# Patient Record
Sex: Male | Born: 1955
Health system: Southern US, Community
[De-identification: ages and names within clinical notes are randomized; demographics above are authoritative.]

## PROBLEM LIST (undated history)

## (undated) DIAGNOSIS — I471 Supraventricular tachycardia, unspecified: Secondary | ICD-10-CM

---

## 2017-08-05 ENCOUNTER — Encounter: Payer: Self-pay | Admitting: *Deleted

## 2017-11-17 ENCOUNTER — Ambulatory Visit
Admission: RE | Admit: 2017-11-17 | Discharge: 2017-11-17 | Disposition: A | Discharge status: Home / Self Care | Payer: Self-pay | Source: Ambulatory Visit | Attending: Internal Medicine | Admitting: Internal Medicine

## 2017-11-17 ENCOUNTER — Other Ambulatory Visit: Payer: Self-pay | Admitting: Internal Medicine

## 2017-11-17 DIAGNOSIS — R52 Pain, unspecified: Secondary | ICD-10-CM

## 2017-11-17 DIAGNOSIS — R079 Chest pain, unspecified: Secondary | ICD-10-CM | POA: Insufficient documentation

## 2019-03-29 DIAGNOSIS — M67911 Unspecified disorder of synovium and tendon, right shoulder: Secondary | ICD-10-CM | POA: Diagnosis not present

## 2019-03-29 DIAGNOSIS — M542 Cervicalgia: Secondary | ICD-10-CM | POA: Diagnosis not present

## 2019-03-29 DIAGNOSIS — K2900 Acute gastritis without bleeding: Secondary | ICD-10-CM | POA: Diagnosis not present

## 2019-03-29 DIAGNOSIS — M489 Spondylopathy, unspecified: Secondary | ICD-10-CM | POA: Diagnosis not present

## 2019-03-30 ENCOUNTER — Other Ambulatory Visit: Payer: Self-pay

## 2019-03-30 ENCOUNTER — Other Ambulatory Visit
Admission: RE | Admit: 2019-03-30 | Discharge: 2019-03-30 | Disposition: A | Discharge status: Home / Self Care | Payer: BC Managed Care – PPO | Source: Ambulatory Visit | Attending: Internal Medicine | Admitting: Internal Medicine

## 2019-03-30 DIAGNOSIS — Z20822 Contact with and (suspected) exposure to covid-19: Secondary | ICD-10-CM

## 2019-03-30 DIAGNOSIS — R197 Diarrhea, unspecified: Secondary | ICD-10-CM | POA: Insufficient documentation

## 2019-03-30 LAB — BASIC METABOLIC PANEL
Anion gap: 10 (ref 5–15)
BUN: 10 mg/dL (ref 8–23)
CO2: 29 mmol/L (ref 22–32)
Calcium: 9.8 mg/dL (ref 8.9–10.3)
Chloride: 102 mmol/L (ref 98–111)
Creatinine, Ser: 1.08 mg/dL (ref 0.61–1.24)
GFR calc Af Amer: >60 mL/min (ref >60)
GFR calc non Af Amer: >60 mL/min (ref >60)
Glucose, Bld: 110 mg/dL — ABNORMAL HIGH (ref 70–99)
Potassium: 4.7 mmol/L (ref 3.5–5.1)
Sodium: 141 mmol/L (ref 135–145)

## 2019-03-30 LAB — CBC
HCT: 46.3 % (ref 39.0–52.0)
Hemoglobin: 15 g/dL (ref 13.0–17.0)
MCH: 28.2 pg (ref 26.0–34.0)
MCHC: 32.4 g/dL (ref 30.0–36.0)
MCV: 87.2 fL (ref 80.0–100.0)
Platelets: 171 10*3/uL (ref 150–400)
RBC: 5.31 MIL/uL (ref 4.22–5.81)
RDW: 13.5 % (ref 11.5–15.5)
WBC: 3.5 10*3/uL — ABNORMAL LOW (ref 4.0–10.5)
nRBC: 0 % (ref 0.0–0.2)

## 2019-03-30 LAB — C-REACTIVE PROTEIN: CRP: 0.6 mg/dL (ref <1.0)

## 2019-03-30 LAB — FIBRIN DERIVATIVES D-DIMER (ARMC ONLY): Fibrin derivatives D-dimer (ARMC): 186.78 ng/mL (FEU) (ref 0.00–499.00)

## 2019-03-30 LAB — FERRITIN: Ferritin: 79 ng/mL (ref 24–336)

## 2019-04-01 DIAGNOSIS — M67911 Unspecified disorder of synovium and tendon, right shoulder: Secondary | ICD-10-CM | POA: Diagnosis not present

## 2019-04-01 DIAGNOSIS — K2900 Acute gastritis without bleeding: Secondary | ICD-10-CM | POA: Diagnosis not present

## 2019-04-01 DIAGNOSIS — B349 Viral infection, unspecified: Secondary | ICD-10-CM | POA: Diagnosis not present

## 2019-04-01 DIAGNOSIS — J309 Allergic rhinitis, unspecified: Secondary | ICD-10-CM | POA: Diagnosis not present

## 2019-04-01 LAB — NOVEL CORONAVIRUS, NAA: SARS-CoV-2, NAA: DETECTED — AB

## 2019-08-16 DIAGNOSIS — J309 Allergic rhinitis, unspecified: Secondary | ICD-10-CM | POA: Diagnosis not present

## 2019-08-16 DIAGNOSIS — M67911 Unspecified disorder of synovium and tendon, right shoulder: Secondary | ICD-10-CM | POA: Diagnosis not present

## 2019-08-16 DIAGNOSIS — M542 Cervicalgia: Secondary | ICD-10-CM | POA: Diagnosis not present

## 2019-08-16 DIAGNOSIS — N481 Balanitis: Secondary | ICD-10-CM | POA: Diagnosis not present

## 2019-09-08 ENCOUNTER — Other Ambulatory Visit: Payer: Self-pay | Admitting: Internal Medicine

## 2019-09-20 ENCOUNTER — Telehealth: Payer: Self-pay | Admitting: Internal Medicine

## 2019-09-20 DIAGNOSIS — J209 Acute bronchitis, unspecified: Secondary | ICD-10-CM | POA: Diagnosis not present

## 2019-09-20 DIAGNOSIS — Z20822 Contact with and (suspected) exposure to covid-19: Secondary | ICD-10-CM | POA: Diagnosis not present

## 2019-09-20 DIAGNOSIS — J029 Acute pharyngitis, unspecified: Secondary | ICD-10-CM | POA: Diagnosis not present

## 2019-09-20 DIAGNOSIS — J019 Acute sinusitis, unspecified: Secondary | ICD-10-CM | POA: Diagnosis not present

## 2019-09-20 NOTE — Telephone Encounter (Signed)
Patient will need an apt before anything could be called in and we do not have any availability today. Patient advised to go to Urgent care.

## 2019-09-20 NOTE — Telephone Encounter (Signed)
Patient has a cold and wants to know what he should he do he is out of work today because of this.

## 2019-10-01 ENCOUNTER — Other Ambulatory Visit: Payer: Self-pay | Admitting: Internal Medicine

## 2019-10-15 DIAGNOSIS — J019 Acute sinusitis, unspecified: Secondary | ICD-10-CM | POA: Diagnosis not present

## 2019-10-15 DIAGNOSIS — B9689 Other specified bacterial agents as the cause of diseases classified elsewhere: Secondary | ICD-10-CM | POA: Diagnosis not present

## 2019-10-15 DIAGNOSIS — N481 Balanitis: Secondary | ICD-10-CM | POA: Diagnosis not present

## 2019-11-14 ENCOUNTER — Other Ambulatory Visit: Payer: Self-pay | Admitting: Internal Medicine

## 2019-11-15 ENCOUNTER — Other Ambulatory Visit: Payer: Self-pay

## 2019-11-15 MED ORDER — LORATADINE 10 MG PO TABS: 10.0000 mg | ORAL_TABLET | Freq: Every day | ORAL | 1 refills | Status: DC

## 2019-12-10 ENCOUNTER — Other Ambulatory Visit: Payer: Self-pay | Admitting: Internal Medicine

## 2020-01-10 ENCOUNTER — Other Ambulatory Visit: Payer: Self-pay | Admitting: Internal Medicine

## 2020-02-05 ENCOUNTER — Other Ambulatory Visit: Payer: Self-pay | Admitting: Internal Medicine

## 2020-02-14 ENCOUNTER — Other Ambulatory Visit: Payer: Self-pay | Admitting: *Deleted

## 2020-02-14 MED ORDER — METOPROLOL SUCCINATE ER 25 MG PO TB24: 25.0000 mg | ORAL_TABLET | Freq: Two times a day (BID) | ORAL | 4 refills | Status: DC

## 2020-03-04 ENCOUNTER — Other Ambulatory Visit: Payer: Self-pay | Admitting: Internal Medicine

## 2020-03-31 ENCOUNTER — Other Ambulatory Visit: Payer: Self-pay

## 2020-03-31 MED ORDER — OMEPRAZOLE 20 MG PO CPDR: 20.0000 mg | DELAYED_RELEASE_CAPSULE | Freq: Two times a day (BID) | ORAL | 1 refills | Status: DC

## 2020-04-02 ENCOUNTER — Other Ambulatory Visit: Payer: Self-pay | Admitting: Internal Medicine

## 2020-05-01 ENCOUNTER — Other Ambulatory Visit: Payer: Self-pay | Admitting: Internal Medicine

## 2020-05-28 ENCOUNTER — Other Ambulatory Visit: Payer: Self-pay | Admitting: Internal Medicine

## 2020-06-25 ENCOUNTER — Other Ambulatory Visit: Payer: Self-pay | Admitting: Internal Medicine

## 2020-07-23 ENCOUNTER — Other Ambulatory Visit: Payer: Self-pay | Admitting: Internal Medicine

## 2020-08-21 ENCOUNTER — Other Ambulatory Visit: Payer: Self-pay | Admitting: Internal Medicine

## 2020-09-17 ENCOUNTER — Other Ambulatory Visit: Payer: Self-pay | Admitting: Internal Medicine

## 2020-10-02 ENCOUNTER — Other Ambulatory Visit: Payer: Self-pay | Admitting: Internal Medicine

## 2020-10-16 ENCOUNTER — Other Ambulatory Visit: Payer: Self-pay | Admitting: Internal Medicine

## 2020-11-12 ENCOUNTER — Other Ambulatory Visit: Payer: Self-pay | Admitting: Internal Medicine

## 2020-12-11 ENCOUNTER — Other Ambulatory Visit: Payer: Self-pay | Admitting: Internal Medicine

## 2021-01-10 ENCOUNTER — Other Ambulatory Visit: Payer: Self-pay | Admitting: Internal Medicine

## 2021-01-21 DIAGNOSIS — Z20822 Contact with and (suspected) exposure to covid-19: Secondary | ICD-10-CM | POA: Diagnosis not present

## 2021-01-21 DIAGNOSIS — Z03818 Encounter for observation for suspected exposure to other biological agents ruled out: Secondary | ICD-10-CM | POA: Diagnosis not present

## 2021-01-21 DIAGNOSIS — J019 Acute sinusitis, unspecified: Secondary | ICD-10-CM | POA: Diagnosis not present

## 2021-02-12 ENCOUNTER — Other Ambulatory Visit: Payer: Self-pay | Admitting: Internal Medicine

## 2021-03-12 ENCOUNTER — Other Ambulatory Visit: Payer: Self-pay | Admitting: Internal Medicine

## 2021-03-20 DIAGNOSIS — U071 COVID-19: Secondary | ICD-10-CM | POA: Diagnosis not present

## 2021-03-20 DIAGNOSIS — Z03818 Encounter for observation for suspected exposure to other biological agents ruled out: Secondary | ICD-10-CM | POA: Diagnosis not present

## 2021-03-20 DIAGNOSIS — M79641 Pain in right hand: Secondary | ICD-10-CM | POA: Diagnosis not present

## 2021-04-02 ENCOUNTER — Other Ambulatory Visit: Payer: Self-pay | Admitting: Internal Medicine

## 2021-04-02 DIAGNOSIS — M65321 Trigger finger, right index finger: Secondary | ICD-10-CM | POA: Diagnosis not present

## 2021-04-02 DIAGNOSIS — M65831 Other synovitis and tenosynovitis, right forearm: Secondary | ICD-10-CM | POA: Diagnosis not present

## 2021-04-02 DIAGNOSIS — M25541 Pain in joints of right hand: Secondary | ICD-10-CM | POA: Diagnosis not present

## 2021-04-02 DIAGNOSIS — M67441 Ganglion, right hand: Secondary | ICD-10-CM | POA: Diagnosis not present

## 2021-04-02 DIAGNOSIS — M7989 Other specified soft tissue disorders: Secondary | ICD-10-CM | POA: Diagnosis not present

## 2021-04-08 ENCOUNTER — Other Ambulatory Visit: Payer: Self-pay

## 2021-04-08 ENCOUNTER — Emergency Department
Admission: EM | Admit: 2021-04-08 | Discharge: 2021-04-08 | Disposition: A | Discharge status: Home / Self Care | Payer: BC Managed Care – PPO | Attending: Emergency Medicine | Admitting: Emergency Medicine

## 2021-04-08 ENCOUNTER — Other Ambulatory Visit
Admission: RE | Admit: 2021-04-08 | Discharge: 2021-04-08 | Disposition: A | Discharge status: Home / Self Care | Payer: BC Managed Care – PPO | Source: Ambulatory Visit | Attending: Family Medicine | Admitting: Family Medicine

## 2021-04-08 ENCOUNTER — Emergency Department: Payer: BC Managed Care – PPO

## 2021-04-08 DIAGNOSIS — R7989 Other specified abnormal findings of blood chemistry: Secondary | ICD-10-CM | POA: Insufficient documentation

## 2021-04-08 DIAGNOSIS — I499 Cardiac arrhythmia, unspecified: Secondary | ICD-10-CM | POA: Diagnosis not present

## 2021-04-08 DIAGNOSIS — R202 Paresthesia of skin: Secondary | ICD-10-CM | POA: Diagnosis not present

## 2021-04-08 DIAGNOSIS — R778 Other specified abnormalities of plasma proteins: Secondary | ICD-10-CM | POA: Diagnosis not present

## 2021-04-08 DIAGNOSIS — R079 Chest pain, unspecified: Secondary | ICD-10-CM | POA: Insufficient documentation

## 2021-04-08 DIAGNOSIS — F17210 Nicotine dependence, cigarettes, uncomplicated: Secondary | ICD-10-CM | POA: Insufficient documentation

## 2021-04-08 DIAGNOSIS — R1013 Epigastric pain: Secondary | ICD-10-CM | POA: Diagnosis not present

## 2021-04-08 DIAGNOSIS — K219 Gastro-esophageal reflux disease without esophagitis: Secondary | ICD-10-CM | POA: Diagnosis not present

## 2021-04-08 DIAGNOSIS — R002 Palpitations: Secondary | ICD-10-CM | POA: Diagnosis not present

## 2021-04-08 LAB — CBC
HCT: 43.8 % (ref 39.0–52.0)
Hemoglobin: 14.3 g/dL (ref 13.0–17.0)
MCH: 28.2 pg (ref 26.0–34.0)
MCHC: 32.6 g/dL (ref 30.0–36.0)
MCV: 86.4 fL (ref 80.0–100.0)
Platelets: 266 10*3/uL (ref 150–400)
RBC: 5.07 MIL/uL (ref 4.22–5.81)
RDW: 13.4 % (ref 11.5–15.5)
WBC: 7.5 10*3/uL (ref 4.0–10.5)
nRBC: 0 % (ref 0.0–0.2)

## 2021-04-08 LAB — BASIC METABOLIC PANEL
Anion gap: 8 (ref 5–15)
BUN: 14 mg/dL (ref 8–23)
CO2: 27 mmol/L (ref 22–32)
Calcium: 9.4 mg/dL (ref 8.9–10.3)
Chloride: 102 mmol/L (ref 98–111)
Creatinine, Ser: 1.1 mg/dL (ref 0.61–1.24)
GFR, Estimated: >60 mL/min (ref >60)
Glucose, Bld: 113 mg/dL — ABNORMAL HIGH (ref 70–99)
Potassium: 3.7 mmol/L (ref 3.5–5.1)
Sodium: 137 mmol/L (ref 135–145)

## 2021-04-08 LAB — TROPONIN I (HIGH SENSITIVITY): Troponin I (High Sensitivity): 541 ng/L (ref <18)

## 2021-04-08 NOTE — ED Notes (Signed)
Dr. Cyril Loosen notified via secure of troponin level

## 2021-04-08 NOTE — ED Notes (Signed)
Charge rn notified of elevated troponin, waiting on bed placement

## 2021-04-08 NOTE — ED Provider Notes (Signed)
[facility] Emergency Department Provider Note   ____________________________________________    I have reviewed the triage vital signs and the nursing notes.   HISTORY  Chief Complaint Chest Pain     HPI Austin Copeland is a 65 y.o. male who was referred to the emergency department by [facility] clinic.  Patient reports that he had palpitations yesterday at 5:00, he reports he has had this about 3 times over the last 6 months.  Today he is feeling better, went to get evaluated at urgent care, they apparently sent a troponin which was elevated so he was referred to the emergency department.  He denies chest pain today.  No shortness of breath.  Past medical history GERD  There are no problems to display for this patient.     Prior to Admission medications   Medication Sig Start Date End Date Taking? Authorizing Provider  loratadine (CLARITIN) 10 MG tablet TAKE 1 TABLET BY MOUTH EVERY DAY 03/12/21   Corky Downs, MD  metoprolol succinate (TOPROL-XL) 25 MG 24 hr tablet Take 1 tablet (25 mg total) by mouth 2 (two) times daily. 02/14/20   Corky Downs, MD  metoprolol succinate (TOPROL-XL) 50 MG 24 hr tablet TAKE 1/2 TABLET BY MOUTH TWICE A DAY 10/16/20   Corky Downs, MD  omeprazole (PRILOSEC) 20 MG capsule TAKE 1 CAPSULE BY MOUTH TWICE A DAY 04/02/21   Corky Downs, MD     Allergies Patient has no known allergies.  No family history on file.  Social History Social History   Tobacco Use   Smoking status: Every Day    Types: Cigarettes    Review of Systems  Constitutional: No fever/chills Eyes: No visual changes.  ENT: No sore throat. Cardiovascular: No chest pain, palpitations as above, resolved yesterday Respiratory: Denies shortness of breath. Gastrointestinal: No abdominal pain.  No nausea, no vomiting.   Genitourinary: Negative for dysuria. Musculoskeletal: Negative for back pain. Skin: Negative for rash. Neurological: Negative for  headaches or weakness   ____________________________________________   PHYSICAL EXAM:  VITAL SIGNS: ED Triage Vitals  Enc Vitals Group     BP 04/08/21 1920 (!) 150/87     Pulse Rate 04/08/21 1920 78     Resp 04/08/21 1920 18     Temp 04/08/21 1920 97.8 F (36.6 C)     Temp Source 04/08/21 1920 Oral     SpO2 04/08/21 1920 100 %     Weight 04/08/21 2117 75.8 kg (167 lb)     Height 04/08/21 2117 1.829 m (6')     Head Circumference --      Peak Flow --      Pain Score 04/08/21 1935 0     Pain Loc --      Pain Edu? --      Excl. in GC? --     Constitutional: Alert and oriented. No acute distress. Pleasant and interactive Eyes: Conjunctivae are normal.  Head: Atraumatic. Nose: No congestion/rhinnorhea.  Cardiovascular: Normal rate, regular rhythm. Grossly normal heart sounds.  Good peripheral circulation. Respiratory: Normal respiratory effort.  No retractions. Lungs CTAB. Gastrointestinal: Soft and nontender. No distention.  No CVA tenderness. Genitourinary: deferred Musculoskeletal: No lower extremity tenderness nor edema.  Warm and well perfused Neurologic:  Normal speech and language. No gross focal neurologic deficits are appreciated.  Skin:  Skin is warm, dry and intact. No rash noted. Psychiatric: Mood and affect are normal. Speech and behavior are normal.  ____________________________________________   LABS (all labs  ordered are listed, but only abnormal results are displayed)  Labs Reviewed  BASIC METABOLIC PANEL - Abnormal; Notable for the following components:      Result Value   Glucose, Bld 113 (*)    All other components within normal limits  TROPONIN I (HIGH SENSITIVITY) - Abnormal; Notable for the following components:   Troponin I (High Sensitivity) 541 (*)    All other components within normal limits  CBC   ____________________________________________  EKG  ED ECG REPORT I, Jene Every, the attending physician, personally viewed and  interpreted this ECG.  Date: 04/08/2021  Rhythm: normal sinus rhythm QRS Axis: normal Intervals: normal ST/T Wave abnormalities: normal Narrative Interpretation: no evidence of acute ischemia  ____________________________________________  RADIOLOGY  Chest x-ray viewed by me, no acute abnormality ____________________________________________   PROCEDURES  Procedure(s) performed: No  Procedures   Critical Care performed: No ____________________________________________   INITIAL IMPRESSION / ASSESSMENT AND PLAN / ED COURSE  Pertinent labs & imaging results that were available during my care of the patient were reviewed by me and considered in my medical decision making (see chart for details).   Patient presents with elevated troponin in the setting of palpitations yesterday at 5:00pm, resolved today.  No significant chest pain reported, no diaphoresis, no shortness of breath, no pleurisy  Troponin of 608 at the clinic, 541 now  Discussed with Dr. Mariah Milling of cardiology who suspects elevated troponin related to arrhythmia, recommends close follow-up with him in his office  Discussed this with patient who agrees with this plan, strict return precautions discussed    ____________________________________________   FINAL CLINICAL IMPRESSION(S) / ED DIAGNOSES  Final diagnoses:  Cardiac arrhythmia, unspecified cardiac arrhythmia type  Elevated troponin        Note:  This document was prepared using Dragon voice recognition software and may include unintentional dictation errors.    Jene Every, MD 04/08/21 2237

## 2021-04-08 NOTE — ED Notes (Signed)
Presents to the ER by himself after being notified by clinic of abnormal troponin level. Pt stated that he had episode last night that started around 1700 of heart palpitations and tightening L side of chest. Non exertional as he was sitting in chair watching tv. Felt his pulse in his neck, diaphoretic with some nausea. Pt stated that he didn't feel better until about 0400. Was seen at clinic ekg, labs completed. A&Ox4. Skin p/w/d. RR even and nonlabored. Ambulatory with steady gait. Sinus Rhythm per cardiac monitor. HR 71. RA. Slight HA at this time. Denies chest pain.

## 2021-04-08 NOTE — ED Triage Notes (Signed)
Pt arrives with c/o chest pain that started yesterday. Per pt, he was sent over by MD because troponin was elevated. Pt is pain free at this time.

## 2021-04-08 NOTE — ED Provider Notes (Signed)
  Emergency Medicine Provider Triage Evaluation Note  Austin Copeland , a 65 y.o.male,  was evaluated in triage.  Pt complains of chest/abdominal discomfort.  Patient was seen earlier [facility] where he received some basic labs.  He was treated with a GI cocktail, but they note that his troponin level was markedly high at 608.  Advised that he report to the emergency department for repeat check.  EKG is unremarkable.   Review of Systems  Positive: Asymptomatic at this time Negative: Denies fever, chest pain, vomiting  Physical Exam   Vitals:   04/08/21 1920  BP: (!) 150/87  Pulse: 78  Resp: 18  Temp: 97.8 F (36.6 C)  SpO2: 100%   Gen:   Awake, no distress   Resp:  Normal effort  MSK:   Moves extremities without difficulty  Other:    Medical Decision Making  Given the patient's initial medical screening exam, the following diagnostic evaluation has been ordered. The patient will be placed in the appropriate treatment space, once one is available, to complete the evaluation and treatment. I have discussed the plan of care with the patient and I have advised the patient that an ED physician or mid-level practitioner will reevaluate their condition after the test results have been received, as the results may give them additional insight into the type of treatment they may need.    Diagnostics: Troponin  Treatments: none immediately   Varney Daily, PA 04/08/21 1935    Merwyn Katos, MD 04/08/21 2351

## 2021-04-09 ENCOUNTER — Other Ambulatory Visit: Payer: Self-pay | Admitting: Internal Medicine

## 2021-04-09 LAB — TROPONIN I (HIGH SENSITIVITY): Troponin I (High Sensitivity): 608 ng/L (ref <18)

## 2021-04-13 ENCOUNTER — Ambulatory Visit (INDEPENDENT_AMBULATORY_CARE_PROVIDER_SITE_OTHER): Payer: BC Managed Care – PPO | Admitting: Cardiology

## 2021-04-13 ENCOUNTER — Encounter: Payer: Self-pay | Admitting: Cardiology

## 2021-04-13 ENCOUNTER — Other Ambulatory Visit: Payer: Self-pay

## 2021-04-13 VITALS — BP 150/84 | HR 66 | Ht 72.0 in | Wt 168.0 lb

## 2021-04-13 DIAGNOSIS — I10 Essential (primary) hypertension: Secondary | ICD-10-CM | POA: Diagnosis not present

## 2021-04-13 DIAGNOSIS — F17200 Nicotine dependence, unspecified, uncomplicated: Secondary | ICD-10-CM

## 2021-04-13 DIAGNOSIS — Z13220 Encounter for screening for lipoid disorders: Secondary | ICD-10-CM

## 2021-04-13 DIAGNOSIS — R0600 Dyspnea, unspecified: Secondary | ICD-10-CM | POA: Diagnosis not present

## 2021-04-13 DIAGNOSIS — R778 Other specified abnormalities of plasma proteins: Secondary | ICD-10-CM

## 2021-04-13 MED ORDER — METOPROLOL TARTRATE 100 MG PO TABS: 100.0000 mg | ORAL_TABLET | Freq: Once | ORAL | 0 refills | Status: DC

## 2021-04-13 NOTE — Progress Notes (Signed)
Cardiology Office Note:    Date:  04/13/2021   ID:  Austin Copeland, DOB 12/12/1955, MRN [id number]  PCP:  Corky Downs, MD   [facility] Providers Cardiologist:  None     Referring MD: Corky Downs, MD   Chief Complaint  Patient presents with   New Patient (Initial Visit)    ED follow up -- Elevated troponin. Meds reviewed verbally with patient.    Austin Copeland is a 65 y.o. male who is being seen today for the evaluation of abnormal troponins.  At the request of Corky Downs, MD.   History of Present Illness:    Austin Copeland is a 65 y.o. male with a hx of hypertension, GERD, smoker x30+ years who presents due to palpitations and abnormal troponins.    Patient had a history of palpitations occurring last week prompting him to go to urgent care.  He was sitting at home, when he suddenly felt his heart rate elevated.  He felt diaphoretic and also short of breath.  At urgent care, troponins were obtained which was noted to be elevated.  He was advised to go to the emergency room.   In the ED, EKG did not show any acute ischemia, troponin was elevated at 608, 541.  He denies chest pain .  Previous episode of palpitations was 2 months ago not associated with exertion.  He smokes about 4 cigarettes a day, about 30+ years now.  States quitting smoking after episode of palpitations occurred.  Denies any personal or family history of heart disease.  History reviewed. No pertinent past medical history.  History reviewed. No pertinent surgical history.  Current Medications: Current Meds  Medication Sig   esomeprazole (NEXIUM) 20 MG capsule Take 20 mg by mouth 2 (two) times daily before a meal.   loratadine (CLARITIN) 10 MG tablet TAKE 1 TABLET BY MOUTH EVERY DAY   metoprolol succinate (TOPROL-XL) 25 MG 24 hr tablet Take 1 tablet (25 mg total) by mouth 2 (two) times daily.   metoprolol tartrate (LOPRESSOR) 100 MG tablet Take 1 tablet (100 mg total) by mouth once for 1 dose. Take 2 hours  prior to your CT scan.     Allergies:   Patient has no known allergies.   Social History   Socioeconomic History   Marital status: Single    Spouse name: Not on file   Number of children: Not on file   Years of education: Not on file   Highest education level: Not on file  Occupational History   Not on file  Tobacco Use   Smoking status: Every Day    Types: Cigarettes   Smokeless tobacco: Never  Vaping Use   Vaping Use: Never used  Substance and Sexual Activity   Alcohol use: Never   Drug use: Never   Sexual activity: Not on file  Other Topics Concern   Not on file  Social History Narrative   Not on file   Social Determinants of Health   Financial Resource Strain: Not on file  Food Insecurity: Not on file  Transportation Needs: Not on file  Physical Activity: Not on file  Stress: Not on file  Social Connections: Not on file     Family History: The patient's family history is not on file.  ROS:   Please see the history of present illness.     All other systems reviewed and are negative.  EKGs/Labs/Other Studies Reviewed:    The following studies were reviewed today:   EKG:  EKG is  ordered today.  The ekg ordered today demonstrates normal sinus rhythm, normal ECG.  Recent Labs: 04/08/2021: BUN 14; Creatinine, Ser 1.10; Hemoglobin 14.3; Platelets 266; Potassium 3.7; Sodium 137  Recent Lipid Panel No results found for: CHOL, TRIG, HDL, CHOLHDL, VLDL, LDLCALC, LDLDIRECT   Risk Assessment/Calculations:          Physical Exam:    VS:  BP (!) 150/84 (BP Location: Left Arm, Patient Position: Sitting, Cuff Size: Normal)    Pulse 66    Ht 6' (1.829 m)    Wt 168 lb (76.2 kg)    SpO2 99%    BMI 22.78 kg/m     Wt Readings from Last 3 Encounters:  04/13/21 168 lb (76.2 kg)  04/08/21 167 lb (75.8 kg)     GEN:  Well nourished, well developed in no acute distress HEENT: Normal NECK: No JVD; No carotid bruits LYMPHATICS: No lymphadenopathy CARDIAC: RRR, no  murmurs, rubs, gallops RESPIRATORY:  Clear to auscultation without rales, wheezing or rhonchi  ABDOMEN: Soft, non-tender, non-distended MUSCULOSKELETAL:  No edema; No deformity  SKIN: Warm and dry NEUROLOGIC:  Alert and oriented x 3 PSYCHIATRIC:  Normal affect   ASSESSMENT:    1. Dyspnea, unspecified type   2. Elevated troponin   3. Primary hypertension   4. Smoking   5. Screening for hyperlipidemia    PLAN:    In order of problems listed above:  Shortness of breath, elevated troponins, risk factors hypertension, former smoker.  Get echocardiogram, get coronary CTA to evaluate presence of CAD.  Obtain fasting lipid profile. Elevated troponins, echo and coronary CTA as above. Hypertension, BP elevated.  Continue Toprol-XL as prescribed.  Plan to adjust BP meds at follow-up visit based on CTA results. Smoking, recently quit.  Patient congratulated and encouraged to abstain from smoking..  Follow-up after echo and coronary CTA.       Medication Adjustments/Labs and Tests Ordered: Current medicines are reviewed at length with the patient today.  Concerns regarding medicines are outlined above.  Orders Placed This Encounter  Procedures   CT CORONARY MORPH W/CTA COR W/SCORE W/CA W/CM &/OR WO/CM   Lipid panel   EKG 12-Lead   ECHOCARDIOGRAM COMPLETE   Meds ordered this encounter  Medications   metoprolol tartrate (LOPRESSOR) 100 MG tablet    Sig: Take 1 tablet (100 mg total) by mouth once for 1 dose. Take 2 hours prior to your CT scan.    Dispense:  1 tablet    Refill:  0    Patient Instructions  Medication Instructions:   Your physician recommends that you continue on your current medications as directed. Please refer to the Current Medication list given to you today.  *If you need a refill on your cardiac medications before your next appointment, please call your pharmacy*   Lab Work:  Your physician recommends that you return for a FASTING lipid profile: at your  earliest convenience  - You will need to be fasting. Please do not have anything to eat or drink after midnight the morning you have the lab work. You may only have water or black coffee with no cream or sugar.   Please return to our office on ___________________at ___________am/pm    Testing/Procedures:  Your physician has requested that you have an echocardiogram. Echocardiography is a painless test that uses sound waves to create images of your heart. It provides your doctor with information about the size and shape of your heart and how well  your hearts chambers and valves are working. This procedure takes approximately one hour. There are no restrictions for this procedure.   2.  Your physician has requested that you have cardiac CT. Cardiac computed tomography (CT) is a painless test that uses an x-ray machine to take clear, detailed pictures of your heart.     Your cardiac CT will be scheduled at:  [facility] [address] [place], [place] [address] [phone]  Monday Jan. 9  at 8:45   Please arrive 15 mins early for check-in and test prep.     Please follow these instructions carefully (unless otherwise directed):   On the Night Before the Test: Be sure to Drink plenty of water. Do not consume any caffeinated/decaffeinated beverages or chocolate 12 hours prior to your test.    On the Day of the Test: Drink plenty of water until 1 hour prior to the test. Do not eat any food 4 hours prior to the test. You may take your regular medications prior to the test.  Take metoprolol (Lopressor) two hours prior to test.    After the Test: Drink plenty of water. After receiving IV contrast, you may experience a mild flushed feeling. This is normal. On occasion, you may experience a mild rash up to 24 hours after the test. This is not dangerous. If this occurs, you can take Benadryl 25 mg and increase your fluid intake. If  you experience trouble breathing, this can be serious. If it is severe call 911 IMMEDIATELY. If it is mild, please call our office. If you take any of these medications: Glipizide/Metformin, Avandament, Glucavance, please do not take 48 hours after completing test unless otherwise instructed.  Please allow 2-4 weeks for scheduling of routine cardiac CTs. Some insurance companies require a pre-authorization which may delay scheduling of this test.   For non-scheduling related questions, please contact the cardiac imaging nurse navigator should you have any questions/concerns: Rockwell Alexandria, Cardiac Imaging Nurse Navigator Larey Brick, Cardiac Imaging Nurse Navigator [facility] Direct Office Dial: [phone]   For scheduling needs, including cancellations and rescheduling, please call [place], [phone].      Follow-Up: At [facility], you and your health needs are our priority.  As part of our continuing mission to provide you with exceptional heart care, we have created designated Provider Care Teams.  These Care Teams include your primary Cardiologist (physician) and Advanced Practice Providers (APPs -  Physician Assistants and Nurse Practitioners) who all work together to provide you with the care you need, when you need it.  We recommend signing up for the patient portal called "MyChart".  Sign up information is provided on this After Visit Summary.  MyChart is used to connect with patients for Virtual Visits (Telemedicine).  Patients are able to view lab/test results, encounter notes, upcoming appointments, etc.  Non-urgent messages can be sent to your provider as well.   To learn more about what you can do with MyChart, go to [email or web address].    Your next appointment:     Follow up after testing   CCTA on 05/07/20  The format for your next appointment:   In Person  Provider:   Debbe Odea, MD    Other Instructions      Signed, Debbe Odea, MD  04/13/2021 12:43 PM    [facility]

## 2021-04-13 NOTE — Patient Instructions (Signed)
Medication Instructions:   Your physician recommends that you continue on your current medications as directed. Please refer to the Current Medication list given to you today.  *If you need a refill on your cardiac medications before your next appointment, please call your pharmacy*   Lab Work:  Your physician recommends that you return for a FASTING lipid profile: at your earliest convenience  - You will need to be fasting. Please do not have anything to eat or drink after midnight the morning you have the lab work. You may only have water or black coffee with no cream or sugar.   Please return to our office on ___________________at ___________am/pm    Testing/Procedures:  Your physician has requested that you have an echocardiogram. Echocardiography is a painless test that uses sound waves to create images of your heart. It provides your doctor with information about the size and shape of your heart and how well your hearts chambers and valves are working. This procedure takes approximately one hour. There are no restrictions for this procedure.   2.  Your physician has requested that you have cardiac CT. Cardiac computed tomography (CT) is a painless test that uses an x-ray machine to take clear, detailed pictures of your heart.     Your cardiac CT will be scheduled at:  [facility] [address] [place], [place] [address] [phone]  Monday Jan. 9  at 8:45   Please arrive 15 mins early for check-in and test prep.     Please follow these instructions carefully (unless otherwise directed):   On the Night Before the Test: Be sure to Drink plenty of water. Do not consume any caffeinated/decaffeinated beverages or chocolate 12 hours prior to your test.    On the Day of the Test: Drink plenty of water until 1 hour prior to the test. Do not eat any food 4 hours prior to the test. You may take your regular medications  prior to the test.  Take metoprolol (Lopressor) two hours prior to test.    After the Test: Drink plenty of water. After receiving IV contrast, you may experience a mild flushed feeling. This is normal. On occasion, you may experience a mild rash up to 24 hours after the test. This is not dangerous. If this occurs, you can take Benadryl 25 mg and increase your fluid intake. If you experience trouble breathing, this can be serious. If it is severe call 911 IMMEDIATELY. If it is mild, please call our office. If you take any of these medications: Glipizide/Metformin, Avandament, Glucavance, please do not take 48 hours after completing test unless otherwise instructed.  Please allow 2-4 weeks for scheduling of routine cardiac CTs. Some insurance companies require a pre-authorization which may delay scheduling of this test.   For non-scheduling related questions, please contact the cardiac imaging nurse navigator should you have any questions/concerns: Rockwell Alexandria, Cardiac Imaging Nurse Navigator Larey Brick, Cardiac Imaging Nurse Navigator [facility] Direct Office Dial: [phone]   For scheduling needs, including cancellations and rescheduling, please call [place], [phone].      Follow-Up: At [facility], you and your health needs are our priority.  As part of our continuing mission to provide you with exceptional heart care, we have created designated Provider Care Teams.  These Care Teams include your primary Cardiologist (physician) and Advanced Practice Providers (APPs -  Physician Assistants and Nurse Practitioners) who all work together to provide you with the  care you need, when you need it.  We recommend signing up for the patient portal called "MyChart".  Sign up information is provided on this After Visit Summary.  MyChart is used to connect with patients for Virtual Visits (Telemedicine).  Patients are able to view lab/test results,  encounter notes, upcoming appointments, etc.  Non-urgent messages can be sent to your provider as well.   To learn more about what you can do with MyChart, go to [email or web address].    Your next appointment:     Follow up after testing   CCTA on 05/07/20  The format for your next appointment:   In Person  Provider:   Debbe Odea, MD    Other Instructions

## 2021-05-03 ENCOUNTER — Telehealth (HOSPITAL_COMMUNITY): Payer: Self-pay | Admitting: *Deleted

## 2021-05-03 NOTE — Telephone Encounter (Signed)
Attempted to call patient regarding upcoming cardiac CT appointment. °Left message on voicemail with name and callback number ° °Zyon Rosser RN Navigator Cardiac Imaging °[facility] °[phone] Office °[phone] Cell ° °

## 2021-05-04 ENCOUNTER — Telehealth (HOSPITAL_COMMUNITY): Payer: Self-pay | Admitting: *Deleted

## 2021-05-04 NOTE — Telephone Encounter (Signed)
Reaching out to patient to offer assistance regarding upcoming cardiac imaging study; pt verbalizes understanding of appt date/time, parking situation and where to check in, pre-test NPO status and medications ordered, and verified current allergies; name and call back number provided for further questions should they arise  Larey Brick RN Navigator Cardiac Imaging [facility] [phone] office [phone] cell  Patient to hold his daily metoprolol succinate and take 100mg  metoprolol tartrate two hours prior to cardiac CT scan.

## 2021-05-06 ENCOUNTER — Other Ambulatory Visit: Payer: Self-pay | Admitting: Internal Medicine

## 2021-05-07 ENCOUNTER — Ambulatory Visit
Admission: RE | Admit: 2021-05-07 | Discharge: 2021-05-07 | Disposition: A | Discharge status: Home / Self Care | Payer: BC Managed Care – PPO | Source: Ambulatory Visit | Attending: Cardiology | Admitting: Cardiology

## 2021-05-07 ENCOUNTER — Other Ambulatory Visit: Payer: Self-pay

## 2021-05-07 DIAGNOSIS — I2510 Atherosclerotic heart disease of native coronary artery without angina pectoris: Secondary | ICD-10-CM | POA: Diagnosis not present

## 2021-05-07 DIAGNOSIS — R0600 Dyspnea, unspecified: Secondary | ICD-10-CM | POA: Diagnosis not present

## 2021-05-07 MED ORDER — IOHEXOL 350 MG/ML SOLN
75.0000 mL | Freq: Once | INTRAVENOUS | Status: AC | PRN
  Administered 2021-05-07: 75 mL via INTRAVENOUS

## 2021-05-07 MED ORDER — METOPROLOL TARTRATE 5 MG/5ML IV SOLN: 10.0000 mg | Freq: Once | INTRAVENOUS | Status: DC

## 2021-05-07 MED ORDER — NITROGLYCERIN 0.4 MG SL SUBL
0.8000 mg | SUBLINGUAL_TABLET | Freq: Once | SUBLINGUAL | Status: AC
Start: 2021-05-07 — End: 2021-05-07
  Administered 2021-05-07: 0.8 mg via SUBLINGUAL

## 2021-05-07 NOTE — Progress Notes (Signed)
Patient tolerated procedure well. Ambulate w/o difficulty. Denies light headedness or being dizzy. Sitting in chair drinking water provided. Encouraged to drink extra water today and reasoning explained. Verbalized understanding. All questions answered. ABC intact. No further needs. Discharge from procedure area w/o issues.   °

## 2021-05-08 ENCOUNTER — Telehealth: Payer: Self-pay | Admitting: Cardiology

## 2021-05-08 NOTE — Telephone Encounter (Signed)
Debbe Odea, MD  05/08/2021 11:01 AM EST     Mild nonobstructive LAD and RCA disease.  Start aspirin, start Lipitor 40 mg daily.  Obtain fasting lipid profile.

## 2021-05-08 NOTE — Telephone Encounter (Signed)
Attempted to call the patient. No answer- I left a message to please call back (no DPR on file).   

## 2021-05-09 MED ORDER — ATORVASTATIN CALCIUM 40 MG PO TABS: 40.0000 mg | ORAL_TABLET | Freq: Every day | ORAL | 3 refills | Status: DC

## 2021-05-09 MED ORDER — ASPIRIN EC 81 MG PO TBEC
81.0000 mg | DELAYED_RELEASE_TABLET | Freq: Every day | ORAL | 3 refills | Status: AC
Start: 2021-05-09 — End: ?

## 2021-05-09 NOTE — Telephone Encounter (Signed)
Spoke with patient and reviewed the CCTA result note as documented below. Patient confirmed he would be here on 05/18/21 for his Echo and Lipid panel.   Patient also requested a refill on his Nexium which was originally prescribed by his PCP. He stated he thinks it has helped his chest pain.  I informed patient that I would review this with Dr. Garen Lah and get back to him.  Patient verbalized understanding and agreed with plan.

## 2021-05-10 MED ORDER — ESOMEPRAZOLE MAGNESIUM 20 MG PO CPDR: 20.0000 mg | DELAYED_RELEASE_CAPSULE | Freq: Two times a day (BID) | ORAL | 3 refills | Status: DC

## 2021-05-10 NOTE — Telephone Encounter (Signed)
Spoke with Dr. Azucena Cecil and approved of refilling patients Nesxium. Prescription refill sent in. Left a VM for patient informing him of this.

## 2021-05-18 ENCOUNTER — Ambulatory Visit (INDEPENDENT_AMBULATORY_CARE_PROVIDER_SITE_OTHER): Payer: BC Managed Care – PPO

## 2021-05-18 ENCOUNTER — Encounter: Payer: Self-pay | Admitting: Cardiology

## 2021-05-18 ENCOUNTER — Other Ambulatory Visit (INDEPENDENT_AMBULATORY_CARE_PROVIDER_SITE_OTHER): Payer: BC Managed Care – PPO

## 2021-05-18 ENCOUNTER — Other Ambulatory Visit: Payer: Self-pay

## 2021-05-18 DIAGNOSIS — Z13220 Encounter for screening for lipoid disorders: Secondary | ICD-10-CM | POA: Diagnosis not present

## 2021-05-18 DIAGNOSIS — R0600 Dyspnea, unspecified: Secondary | ICD-10-CM

## 2021-05-18 LAB — ECHOCARDIOGRAM COMPLETE
AR max vel: 1.28 cm2
AV Area VTI: 1.25 cm2
AV Area mean vel: 1.28 cm2
AV Mean grad: 5 mmHg
AV Peak grad: 9.4 mmHg
Ao pk vel: 1.53 m/s
Area-P 1/2: 4.1 cm2
Calc EF: 52.5 %
S' Lateral: 3.5 cm
Single Plane A2C EF: 50.8 %
Single Plane A4C EF: 55.3 %

## 2021-05-19 LAB — LIPID PANEL
Chol/HDL Ratio: 2.7 ratio (ref 0.0–5.0)
Cholesterol, Total: 181 mg/dL (ref 100–199)
HDL: 68 mg/dL (ref >39)
LDL Chol Calc (NIH): 103 mg/dL — ABNORMAL HIGH (ref 0–99)
Triglycerides: 50 mg/dL (ref 0–149)
VLDL Cholesterol Cal: 10 mg/dL (ref 5–40)

## 2021-05-24 ENCOUNTER — Ambulatory Visit (INDEPENDENT_AMBULATORY_CARE_PROVIDER_SITE_OTHER): Payer: BC Managed Care – PPO | Admitting: Cardiology

## 2021-05-24 ENCOUNTER — Encounter: Payer: Self-pay | Admitting: Cardiology

## 2021-05-24 ENCOUNTER — Other Ambulatory Visit: Payer: Self-pay

## 2021-05-24 VITALS — BP 130/70 | HR 62 | Ht 72.0 in | Wt 179.0 lb

## 2021-05-24 DIAGNOSIS — I10 Essential (primary) hypertension: Secondary | ICD-10-CM

## 2021-05-24 DIAGNOSIS — I2510 Atherosclerotic heart disease of native coronary artery without angina pectoris: Secondary | ICD-10-CM | POA: Diagnosis not present

## 2021-05-24 NOTE — Progress Notes (Signed)
Cardiology Office Note:    Date:  05/24/2021   ID:  Austin Copeland, DOB [birth year], MRN [id number]  PCP:  Corky DownsMasoud, Javed, MD   [facility] Providers Cardiologist:  None     Referring MD: Corky DownsMasoud, Javed, MD   Chief Complaint  Patient presents with   Other    Follow up post testing -- Meds reviewed verbally with patient.       History of Present Illness:    Austin EuropeJames Copeland is a 66 y.o. male with a hx of hypertension, GERD, former smoker x30+ years who presents for follow-up.  Previously seen due to shortness of breath and abnormal troponins.   Due to risk factors including smoking, hypertension, echo and coronary CTA was obtained.  Coronary CTA 04/2021 mild nonobstructive LAD and RCA disease, calcium score 164. Echocardiogram 04/2021, EF 50 to 55%.    Aspirin and statin was started.  He feels okay, thinks he might of strained a muscle in his back with pushing carts at work.  Has reflux discomfort, improved with taking Nexium.     History reviewed. No pertinent past medical history.  History reviewed. No pertinent surgical history.  Current Medications: Current Meds  Medication Sig   aspirin EC 81 MG tablet Take 1 tablet (81 mg total) by mouth daily. Swallow whole.   atorvastatin (LIPITOR) 40 MG tablet Take 1 tablet (40 mg total) by mouth daily.   esomeprazole (NEXIUM) 20 MG capsule Take 1 capsule (20 mg total) by mouth 2 (two) times daily before a meal.   loratadine (CLARITIN) 10 MG tablet TAKE 1 TABLET BY MOUTH EVERY DAY   metoprolol succinate (TOPROL-XL) 25 MG 24 hr tablet Take 1 tablet (25 mg total) by mouth 2 (two) times daily.     Allergies:   Patient has no known allergies.   Social History   Socioeconomic History   Marital status: Single    Spouse name: Not on file   Number of children: Not on file   Years of education: Not on file   Highest education level: Not on file  Occupational History   Not on file  Tobacco Use   Smoking status: Former    Types:  Cigarettes    Quit date: 03/2021    Years since quitting: 0.1   Smokeless tobacco: Never  Vaping Use   Vaping Use: Never used  Substance and Sexual Activity   Alcohol use: Never   Drug use: Never   Sexual activity: Not on file  Other Topics Concern   Not on file  Social History Narrative   Not on file   Social Determinants of Health   Financial Resource Strain: Not on file  Food Insecurity: Not on file  Transportation Needs: Not on file  Physical Activity: Not on file  Stress: Not on file  Social Connections: Not on file     Family History: The patient's family history is not on file.  ROS:   Please see the history of present illness.     All other systems reviewed and are negative.  EKGs/Labs/Other Studies Reviewed:    The following studies were reviewed today:   EKG:  EKG not ordered today.   Recent Labs: 04/08/2021: BUN 14; Creatinine, Ser 1.10; Hemoglobin 14.3; Platelets 266; Potassium 3.7; Sodium 137  Recent Lipid Panel    Component Value Date/Time   CHOL 181 05/18/2021 0756   TRIG 50 05/18/2021 0756   HDL 68 05/18/2021 0756   CHOLHDL 2.7 05/18/2021 0756   LDLCALC 103 (  H) 05/18/2021 0756     Risk Assessment/Calculations:          Physical Exam:    VS:  BP 130/70 (BP Location: Left Arm, Patient Position: Sitting, Cuff Size: Normal)    Pulse 62    Ht 6' (1.829 m)    Wt 179 lb (81.2 kg)    SpO2 99%    BMI 24.28 kg/m     Wt Readings from Last 3 Encounters:  05/24/21 179 lb (81.2 kg)  04/13/21 168 lb (76.2 kg)  04/08/21 167 lb (75.8 kg)     GEN:  Well nourished, well developed in no acute distress HEENT: Normal NECK: No JVD; No carotid bruits LYMPHATICS: No lymphadenopathy CARDIAC: RRR, no murmurs, rubs, gallops RESPIRATORY:  Clear to auscultation without rales, wheezing or rhonchi  ABDOMEN: Soft, non-tender, non-distended MUSCULOSKELETAL:  No edema; No deformity  SKIN: Warm and dry NEUROLOGIC:  Alert and oriented x 3 PSYCHIATRIC:  Normal  affect   ASSESSMENT:    1. Coronary artery disease involving native coronary artery of native heart without angina pectoris   2. Primary hypertension     PLAN:    In order of problems listed above:  Shortness of breath, elevated troponins, risk factors hypertension, former smoker.  Echocardiogram with EF 50 to 55%.  Coronary CTA with mild nonobstructive LAD and RCA disease.  Denies chest pain.  Continue aspirin 81 mg, Lipitor 40 mg daily. Hypertension, BP controlled.  Continue Toprol-XL as prescribed.  Follow-up yearly.     Medication Adjustments/Labs and Tests Ordered: Current medicines are reviewed at length with the patient today.  Concerns regarding medicines are outlined above.  No orders of the defined types were placed in this encounter.  No orders of the defined types were placed in this encounter.   Patient Instructions  Medication Instructions:   Your physician recommends that you continue on your current medications as directed. Please refer to the Current Medication list given to you today.  *If you need a refill on your cardiac medications before your next appointment, please call your pharmacy*   Lab Work: None ordered If you have labs (blood work) drawn today and your tests are completely normal, you will receive your results only by: MyChart Message (if you have MyChart) OR A paper copy in the mail If you have any lab test that is abnormal or we need to change your treatment, we will call you to review the results.   Testing/Procedures: None ordered   Follow-Up: At [facility], you and your health needs are our priority.  As part of our continuing mission to provide you with exceptional heart care, we have created designated Provider Care Teams.  These Care Teams include your primary Cardiologist (physician) and Advanced Practice Providers (APPs -  Physician Assistants and Nurse Practitioners) who all work together to provide you with the care you  need, when you need it.  We recommend signing up for the patient portal called "MyChart".  Sign up information is provided on this After Visit Summary.  MyChart is used to connect with patients for Virtual Visits (Telemedicine).  Patients are able to view lab/test results, encounter notes, upcoming appointments, etc.  Non-urgent messages can be sent to your provider as well.   To learn more about what you can do with MyChart, go to [email or web address].    Your next appointment:   1 year(s)  The format for your next appointment:   In Person  Provider:   You may see Arlys John  Agbor-Etang, MD or one of the following Advanced Practice Providers on your designated Care Team:   Nicolasa Ducking, NP Eula Listen, PA-C Cadence Fransico Michael, [place]    Other Instructions     Signed, Debbe Odea, MD  05/24/2021 12:27 PM    [facility]

## 2021-05-24 NOTE — Patient Instructions (Signed)

## 2021-06-01 ENCOUNTER — Other Ambulatory Visit: Payer: Self-pay | Admitting: Internal Medicine

## 2021-07-01 ENCOUNTER — Other Ambulatory Visit: Payer: Self-pay | Admitting: Internal Medicine

## 2021-08-02 ENCOUNTER — Other Ambulatory Visit: Payer: Self-pay | Admitting: *Deleted

## 2021-08-02 MED ORDER — ESOMEPRAZOLE MAGNESIUM 20 MG PO CPDR: 20.0000 mg | DELAYED_RELEASE_CAPSULE | Freq: Two times a day (BID) | ORAL | 3 refills | Status: DC

## 2021-08-05 ENCOUNTER — Other Ambulatory Visit: Payer: Self-pay | Admitting: Internal Medicine

## 2021-08-06 ENCOUNTER — Other Ambulatory Visit: Payer: Self-pay | Admitting: *Deleted

## 2021-08-06 MED ORDER — ATORVASTATIN CALCIUM 40 MG PO TABS: 40.0000 mg | ORAL_TABLET | Freq: Every day | ORAL | 5 refills | Status: DC

## 2021-09-03 ENCOUNTER — Other Ambulatory Visit: Payer: Self-pay | Admitting: Internal Medicine

## 2021-10-21 ENCOUNTER — Other Ambulatory Visit: Payer: Self-pay | Admitting: Internal Medicine

## 2021-11-02 ENCOUNTER — Other Ambulatory Visit: Payer: Self-pay

## 2021-11-02 MED ORDER — ESOMEPRAZOLE MAGNESIUM 20 MG PO CPDR: 20.0000 mg | DELAYED_RELEASE_CAPSULE | Freq: Two times a day (BID) | ORAL | 3 refills | Status: DC

## 2021-11-07 DIAGNOSIS — S39011A Strain of muscle, fascia and tendon of abdomen, initial encounter: Secondary | ICD-10-CM | POA: Diagnosis not present

## 2021-11-08 ENCOUNTER — Other Ambulatory Visit: Payer: Self-pay | Admitting: *Deleted

## 2021-11-08 MED ORDER — ATORVASTATIN CALCIUM 40 MG PO TABS: 40.0000 mg | ORAL_TABLET | Freq: Every day | ORAL | 5 refills | Status: DC

## 2021-11-19 ENCOUNTER — Other Ambulatory Visit: Payer: Self-pay | Admitting: Internal Medicine

## 2021-12-05 DIAGNOSIS — R300 Dysuria: Secondary | ICD-10-CM | POA: Diagnosis not present

## 2021-12-05 DIAGNOSIS — R399 Unspecified symptoms and signs involving the genitourinary system: Secondary | ICD-10-CM | POA: Diagnosis not present

## 2021-12-05 DIAGNOSIS — Z118 Encounter for screening for other infectious and parasitic diseases: Secondary | ICD-10-CM | POA: Diagnosis not present

## 2021-12-05 DIAGNOSIS — N342 Other urethritis: Secondary | ICD-10-CM | POA: Diagnosis not present

## 2021-12-05 DIAGNOSIS — Z113 Encounter for screening for infections with a predominantly sexual mode of transmission: Secondary | ICD-10-CM | POA: Diagnosis not present

## 2021-12-21 ENCOUNTER — Telehealth: Payer: Self-pay | Admitting: Cardiology

## 2021-12-21 NOTE — Telephone Encounter (Signed)
*  STAT* If patient is at the pharmacy, call can be transferred to refill team.   1. Which medications need to be refilled? (please list name of each medication and dose if known) esomeprazole (NEXIUM) 20 MG capsule  2. Which pharmacy/location (including street and city if local pharmacy) is medication to be sent to? [id number] - [place], [place] - [address]  3. Do they need a 30 day or 90 day supply? 90   Pt has a f/u 05/20/22 with Dr. Azucena Cecil

## 2021-12-21 NOTE — Telephone Encounter (Signed)
Spoke with Austin Copeland regarding the Nexium refill. Told Austin Copeland to contact his PCP or Gastroenterologist for refills on Nexium. I explained to him that Dr. Azucena Cecil refilled until he could get an appointment with his PCP or Gastroenterologist. The patient was very appreciative of the information and will contact his PCP for refills on Nexium.

## 2021-12-25 ENCOUNTER — Encounter: Payer: Self-pay | Admitting: Internal Medicine

## 2021-12-25 ENCOUNTER — Ambulatory Visit (INDEPENDENT_AMBULATORY_CARE_PROVIDER_SITE_OTHER): Payer: BC Managed Care – PPO | Admitting: Internal Medicine

## 2021-12-25 VITALS — BP 123/71 | HR 68 | Temp 97.8°F | Resp 16 | Ht 72.0 in | Wt 170.4 lb

## 2021-12-25 DIAGNOSIS — K219 Gastro-esophageal reflux disease without esophagitis: Secondary | ICD-10-CM | POA: Diagnosis not present

## 2021-12-25 DIAGNOSIS — E785 Hyperlipidemia, unspecified: Secondary | ICD-10-CM | POA: Diagnosis not present

## 2021-12-25 DIAGNOSIS — R000 Tachycardia, unspecified: Secondary | ICD-10-CM | POA: Diagnosis not present

## 2021-12-25 MED ORDER — METOPROLOL SUCCINATE ER 25 MG PO TB24: 25.0000 mg | ORAL_TABLET | Freq: Two times a day (BID) | ORAL | 3 refills | Status: DC

## 2021-12-25 NOTE — Progress Notes (Signed)
Established Patient Office Visit  Subjective:  Patient ID: Austin Copeland, male    DOB: 06-26-1955  Age: 66 y.o. MRN: [id number]  CC:  Chief Complaint  Patient presents with   Hypertension    Hypertension    Austin Copeland presents for check up  History reviewed. No pertinent past medical history.  History reviewed. No pertinent surgical history.  History reviewed. No pertinent family history.  Social History   Socioeconomic History   Marital status: Single    Spouse name: Not on file   Number of children: Not on file   Years of education: Not on file   Highest education level: Not on file  Occupational History   Not on file  Tobacco Use   Smoking status: Former    Types: Cigarettes    Quit date: 03/2021    Years since quitting: 0.7   Smokeless tobacco: Never  Vaping Use   Vaping Use: Never used  Substance and Sexual Activity   Alcohol use: Never   Drug use: Never   Sexual activity: Not on file  Other Topics Concern   Not on file  Social History Narrative   Not on file   Social Determinants of Health   Financial Resource Strain: Not on file  Food Insecurity: Not on file  Transportation Needs: Not on file  Physical Activity: Not on file  Stress: Not on file  Social Connections: Not on file  Intimate Partner Violence: Not on file     Current Outpatient Medications:    aspirin EC 81 MG tablet, Take 1 tablet (81 mg total) by mouth daily. Swallow whole., Disp: 90 tablet, Rfl: 3   atorvastatin (LIPITOR) 40 MG tablet, Take 1 tablet (40 mg total) by mouth daily., Disp: 30 tablet, Rfl: 5   esomeprazole (NEXIUM) 20 MG capsule, Take 1 capsule (20 mg total) by mouth 2 (two) times daily before a meal., Disp: 60 capsule, Rfl: 3   loratadine (CLARITIN) 10 MG tablet, TAKE 1 TABLET BY MOUTH EVERY DAY, Disp: 90 tablet, Rfl: 3   metoprolol succinate (TOPROL-XL) 25 MG 24 hr tablet, Take 1 tablet (25 mg total) by mouth 2 (two) times daily., Disp: 180 tablet, Rfl: 3   No  Known Allergies  ROS Review of Systems  Constitutional: Negative.   HENT: Negative.    Eyes: Negative.   Respiratory: Negative.    Cardiovascular: Negative.   Gastrointestinal: Negative.   Endocrine: Negative.   Genitourinary: Negative.   Musculoskeletal: Negative.   Skin: Negative.   Allergic/Immunologic: Negative.   Neurological: Negative.   Hematological: Negative.   Psychiatric/Behavioral: Negative.    All other systems reviewed and are negative.     Objective:    Physical Exam Vitals reviewed.  Constitutional:      Appearance: Normal appearance.  HENT:     Mouth/Throat:     Mouth: Mucous membranes are moist.  Eyes:     Pupils: Pupils are equal, round, and reactive to light.  Neck:     Vascular: No carotid bruit.  Cardiovascular:     Rate and Rhythm: Normal rate and regular rhythm.     Pulses: Normal pulses.     Heart sounds: Normal heart sounds.  Pulmonary:     Effort: Pulmonary effort is normal.     Breath sounds: Normal breath sounds.  Abdominal:     General: Bowel sounds are normal.     Palpations: Abdomen is soft. There is no hepatomegaly, splenomegaly or mass.     Tenderness:  There is no abdominal tenderness.     Hernia: No hernia is present.  Musculoskeletal:     Cervical back: Neck supple.     Right lower leg: No edema.     Left lower leg: No edema.  Skin:    Findings: No rash.  Neurological:     Mental Status: He is alert and oriented to person, place, and time.     Motor: No weakness.  Psychiatric:        Mood and Affect: Mood normal.        Behavior: Behavior normal.     BP 123/71   Pulse 68   Temp 97.8 F (36.6 C) (Temporal)   Resp 16   Ht 6' (1.829 m)   Wt 170 lb 6.4 oz (77.3 kg)   SpO2 98%   BMI 23.11 kg/m  Wt Readings from Last 3 Encounters:  12/25/21 170 lb 6.4 oz (77.3 kg)  05/24/21 179 lb (81.2 kg)  04/13/21 168 lb (76.2 kg)     Health Maintenance Due  Topic Date Due   COVID-19 Vaccine (1) Never done   Pneumonia  Vaccine 58+ Years old (1 - PCV) Never done   Hepatitis C Screening  Never done   TETANUS/TDAP  Never done   COLONOSCOPY (Pts 45-3yrs Insurance coverage will need to be confirmed)  Never done   Zoster Vaccines- Shingrix (1 of 2) Never done   INFLUENZA VACCINE  11/27/2021    There are no preventive care reminders to display for this patient.  No results found for: "TSH" Lab Results  Component Value Date   WBC 7.5 04/08/2021   HGB 14.3 04/08/2021   HCT 43.8 04/08/2021   MCV 86.4 04/08/2021   PLT 266 04/08/2021   Lab Results  Component Value Date   NA 137 04/08/2021   K 3.7 04/08/2021   CO2 27 04/08/2021   GLUCOSE 113 (H) 04/08/2021   BUN 14 04/08/2021   CREATININE 1.10 04/08/2021   CALCIUM 9.4 04/08/2021   ANIONGAP 8 04/08/2021   Lab Results  Component Value Date   CHOL 181 05/18/2021   Lab Results  Component Value Date   HDL 68 05/18/2021   Lab Results  Component Value Date   LDLCALC 103 (H) 05/18/2021   Lab Results  Component Value Date   TRIG 50 05/18/2021   Lab Results  Component Value Date   CHOLHDL 2.7 05/18/2021   No results found for: "HGBA1C"    Assessment & Plan:   Problem List Items Addressed This Visit       Digestive   Gastroesophageal reflux disease without esophagitis    - The patient's GERD is stable on medication.  - Instructed the patient to avoid eating spicy and acidic foods, as well as foods high in fat. - Instructed the patient to avoid eating large meals or meals 2-3 hours prior to sleeping. Suggest colonoscopy - Endoscopy        Other   Dyslipidemia    Hypercholesterolemia  I advised the patient to follow Mediterranean diet This diet is rich in fruits vegetables and whole grain, and This diet is also rich in fish and lean meat Patient should also eat a handful of almonds or walnuts daily Recent heart study indicated that average follow-up on this kind of diet reduces the cardiovascular mortality by 50 to 70%==       Tachycardia - Primary    Continue metoprolol      Relevant Medications   metoprolol succinate (  TOPROL-XL) 25 MG 24 hr tablet    Meds ordered this encounter  Medications   metoprolol succinate (TOPROL-XL) 25 MG 24 hr tablet    Sig: Take 1 tablet (25 mg total) by mouth 2 (two) times daily.    Dispense:  180 tablet    Refill:  3    Follow-up: No follow-ups on file.    Corky Downs, MD

## 2021-12-25 NOTE — Assessment & Plan Note (Signed)
-   The patient's GERD is stable on medication.  - Instructed the patient to avoid eating spicy and acidic foods, as well as foods high in fat. - Instructed the patient to avoid eating large meals or meals 2-3 hours prior to sleeping. Suggest colonoscopy - Endoscopy

## 2021-12-25 NOTE — Assessment & Plan Note (Signed)
Hypercholesterolemia  I advised the patient to follow Mediterranean diet This diet is rich in fruits vegetables and whole grain, and This diet is also rich in fish and lean meat Patient should also eat a handful of almonds or walnuts daily Recent heart study indicated that average follow-up on this kind of diet reduces the cardiovascular mortality by 50 to 70%== 

## 2021-12-25 NOTE — Assessment & Plan Note (Signed)
Continue metoprolol. 

## 2022-01-15 ENCOUNTER — Ambulatory Visit (INDEPENDENT_AMBULATORY_CARE_PROVIDER_SITE_OTHER): Payer: BC Managed Care – PPO | Admitting: Internal Medicine

## 2022-01-15 ENCOUNTER — Encounter: Payer: Self-pay | Admitting: Internal Medicine

## 2022-01-15 VITALS — BP 149/85 | HR 57 | Ht 72.0 in | Wt 170.2 lb

## 2022-01-15 DIAGNOSIS — E785 Hyperlipidemia, unspecified: Secondary | ICD-10-CM | POA: Diagnosis not present

## 2022-01-15 DIAGNOSIS — R109 Unspecified abdominal pain: Secondary | ICD-10-CM | POA: Diagnosis not present

## 2022-01-15 DIAGNOSIS — Z1211 Encounter for screening for malignant neoplasm of colon: Secondary | ICD-10-CM

## 2022-01-15 DIAGNOSIS — K219 Gastro-esophageal reflux disease without esophagitis: Secondary | ICD-10-CM | POA: Diagnosis not present

## 2022-01-15 DIAGNOSIS — R000 Tachycardia, unspecified: Secondary | ICD-10-CM

## 2022-01-15 LAB — POCT URINALYSIS DIPSTICK
Appearance: NORMAL
Bilirubin, UA: NEGATIVE
Blood, UA: NEGATIVE
Glucose, UA: NEGATIVE
Ketones, UA: NEGATIVE
Leukocytes, UA: NEGATIVE
Nitrite, UA: NEGATIVE
Protein, UA: POSITIVE — AB
Spec Grav, UA: 1.025 (ref 1.010–1.025)
Urobilinogen, UA: 0.2 E.U./dL
pH, UA: 6.5 (ref 5.0–8.0)

## 2022-01-15 MED ORDER — ESOMEPRAZOLE MAGNESIUM 20 MG PO CPDR: 20.0000 mg | DELAYED_RELEASE_CAPSULE | Freq: Two times a day (BID) | ORAL | 3 refills | Status: DC

## 2022-01-15 NOTE — Assessment & Plan Note (Signed)
Hypercholesterolemia  I advised the patient to follow Mediterranean diet This diet is rich in fruits vegetables and whole grain, and This diet is also rich in fish and lean meat Patient should also eat a handful of almonds or walnuts daily Recent heart study indicated that average follow-up on this kind of diet reduces the cardiovascular mortality by 50 to 70%== 

## 2022-01-15 NOTE — Assessment & Plan Note (Signed)
Stable at the present time. 

## 2022-01-15 NOTE — Assessment & Plan Note (Signed)
-   The patient's GERD is stable on medication.  - Instructed the patient to avoid eating spicy and acidic foods, as well as foods high in fat. - Instructed the patient to avoid eating large meals or meals 2-3 hours prior to sleeping. 

## 2022-01-15 NOTE — Assessment & Plan Note (Signed)
We will do ultrasound of the kidney as well as KUB

## 2022-01-15 NOTE — Assessment & Plan Note (Signed)
We will schedule for colonoscopy 

## 2022-01-15 NOTE — Progress Notes (Signed)
Established Patient Office Visit  Subjective:  Patient ID: Austin Copeland, male    DOB: 02/04/1956  Age: 66 y.o. MRN: [id number]  CC:  Chief Complaint  Patient presents with   Flank Pain    Patient states that he has been having some left sided flank pain and some pain in side when urinating. Patient states that sxs started around a week ago. Pain comes and goes and mostly feels pain when urinates. Urine normal other than protein.     HPI  Austin Copeland presents for lt sided pain , prostate was  ok on recent check up  History reviewed. No pertinent past medical history.  History reviewed. No pertinent surgical history.  History reviewed. No pertinent family history.  Social History   Socioeconomic History   Marital status: Single    Spouse name: Not on file   Number of children: Not on file   Years of education: Not on file   Highest education level: Not on file  Occupational History   Not on file  Tobacco Use   Smoking status: Former    Types: Cigarettes    Quit date: 03/2021    Years since quitting: 0.8   Smokeless tobacco: Never  Vaping Use   Vaping Use: Never used  Substance and Sexual Activity   Alcohol use: Never   Drug use: Never   Sexual activity: Not on file  Other Topics Concern   Not on file  Social History Narrative   Not on file   Social Determinants of Health   Financial Resource Strain: Not on file  Food Insecurity: Not on file  Transportation Needs: Not on file  Physical Activity: Not on file  Stress: Not on file  Social Connections: Not on file  Intimate Partner Violence: Not on file     Current Outpatient Medications:    aspirin EC 81 MG tablet, Take 1 tablet (81 mg total) by mouth daily. Swallow whole., Disp: 90 tablet, Rfl: 3   atorvastatin (LIPITOR) 40 MG tablet, Take 1 tablet (40 mg total) by mouth daily., Disp: 30 tablet, Rfl: 5   loratadine (CLARITIN) 10 MG tablet, TAKE 1 TABLET BY MOUTH EVERY DAY, Disp: 90 tablet, Rfl: 3    metoprolol succinate (TOPROL-XL) 25 MG 24 hr tablet, Take 1 tablet (25 mg total) by mouth 2 (two) times daily., Disp: 180 tablet, Rfl: 3   esomeprazole (NEXIUM) 20 MG capsule, Take 1 capsule (20 mg total) by mouth 2 (two) times daily before a meal., Disp: 60 capsule, Rfl: 3   No Known Allergies  ROS Review of Systems  Constitutional: Negative.   HENT: Negative.    Eyes: Negative.   Respiratory: Negative.    Cardiovascular: Negative.   Gastrointestinal: Negative.   Endocrine: Negative.   Genitourinary:  Positive for flank pain. Negative for difficulty urinating, dysuria, hematuria and scrotal swelling.  Skin: Negative.   Allergic/Immunologic: Negative.   Neurological: Negative.  Negative for seizures, weakness and headaches.  Hematological: Negative.   Psychiatric/Behavioral: Negative.    All other systems reviewed and are negative.     Objective:    Physical Exam Vitals reviewed.  Constitutional:      Appearance: Normal appearance.  HENT:     Mouth/Throat:     Mouth: Mucous membranes are moist.  Eyes:     Pupils: Pupils are equal, round, and reactive to light.  Neck:     Vascular: No carotid bruit.  Cardiovascular:     Rate and Rhythm: Normal rate  and regular rhythm.     Pulses: Normal pulses.     Heart sounds: Normal heart sounds.  Pulmonary:     Effort: Pulmonary effort is normal.     Breath sounds: Normal breath sounds.  Abdominal:     General: Bowel sounds are normal.     Palpations: Abdomen is soft. There is no hepatomegaly, splenomegaly or mass.     Tenderness: There is no abdominal tenderness.     Hernia: No hernia is present.  Musculoskeletal:     Cervical back: Neck supple.     Right lower leg: No edema.     Left lower leg: No edema.  Skin:    Findings: No rash.  Neurological:     Mental Status: He is alert and oriented to person, place, and time.     Motor: No weakness.  Psychiatric:        Mood and Affect: Mood normal.        Behavior: Behavior  normal.     BP (!) 149/85   Pulse (!) 57   Ht 6' (1.829 m)   Wt 170 lb 3.2 oz (77.2 kg)   BMI 23.08 kg/m  Wt Readings from Last 3 Encounters:  01/15/22 170 lb 3.2 oz (77.2 kg)  12/25/21 170 lb 6.4 oz (77.3 kg)  05/24/21 179 lb (81.2 kg)     Health Maintenance Due  Topic Date Due   COVID-19 Vaccine (1) Never done   Pneumonia Vaccine 51+ Years old (1 - PCV) Never done   Hepatitis C Screening  Never done   TETANUS/TDAP  Never done   COLONOSCOPY (Pts 45-74yrs Insurance coverage will need to be confirmed)  Never done   Zoster Vaccines- Shingrix (1 of 2) Never done   INFLUENZA VACCINE  Never done    There are no preventive care reminders to display for this patient.  No results found for: "TSH" Lab Results  Component Value Date   WBC 7.5 04/08/2021   HGB 14.3 04/08/2021   HCT 43.8 04/08/2021   MCV 86.4 04/08/2021   PLT 266 04/08/2021   Lab Results  Component Value Date   NA 137 04/08/2021   K 3.7 04/08/2021   CO2 27 04/08/2021   GLUCOSE 113 (H) 04/08/2021   BUN 14 04/08/2021   CREATININE 1.10 04/08/2021   CALCIUM 9.4 04/08/2021   ANIONGAP 8 04/08/2021   Lab Results  Component Value Date   CHOL 181 05/18/2021   Lab Results  Component Value Date   HDL 68 05/18/2021   Lab Results  Component Value Date   LDLCALC 103 (H) 05/18/2021   Lab Results  Component Value Date   TRIG 50 05/18/2021   Lab Results  Component Value Date   CHOLHDL 2.7 05/18/2021   No results found for: "HGBA1C"    Assessment & Plan:   Problem List Items Addressed This Visit       Digestive   Gastroesophageal reflux disease without esophagitis    - The patient's GERD is stable on medication.  - Instructed the patient to avoid eating spicy and acidic foods, as well as foods high in fat. - Instructed the patient to avoid eating large meals or meals 2-3 hours prior to sleeping.      Relevant Medications   esomeprazole (NEXIUM) 20 MG capsule     Other   Dyslipidemia     Hypercholesterolemia  I advised the patient to follow Mediterranean diet This diet is rich in fruits vegetables and whole grain,  and This diet is also rich in fish and lean meat Patient should also eat a handful of almonds or walnuts daily Recent heart study indicated that average follow-up on this kind of diet reduces the cardiovascular mortality by 50 to 70%==      Tachycardia    Stable at the present time.      Side pain - Primary    We will do ultrasound of the kidney as well as KUB      Relevant Orders   POCT urinalysis dipstick (Completed)   DG Abd 1 View   US Renal   Screening for colon cancer    We will schedule for colonoscopy      Relevant Orders   Ambulatory referral to Gastroenterology    Meds ordered this encounter  Medications   esomeprazole (NEXIUM) 20 MG capsule    Sig: Take 1 capsule (20 mg total) by mouth 2 (two) times daily before a meal.    Dispense:  60 capsule    Refill:  3    Follow-up: No follow-ups on file.    Cletis Athens, MD

## 2022-01-17 ENCOUNTER — Other Ambulatory Visit: Payer: Self-pay | Admitting: Internal Medicine

## 2022-01-17 ENCOUNTER — Other Ambulatory Visit: Payer: Self-pay | Admitting: *Deleted

## 2022-01-17 ENCOUNTER — Ambulatory Visit
Admission: RE | Admit: 2022-01-17 | Discharge: 2022-01-17 | Disposition: A | Discharge status: Home / Self Care | Payer: BC Managed Care – PPO | Source: Ambulatory Visit | Attending: Internal Medicine | Admitting: Internal Medicine

## 2022-01-17 DIAGNOSIS — R1031 Right lower quadrant pain: Secondary | ICD-10-CM | POA: Diagnosis not present

## 2022-01-17 DIAGNOSIS — R109 Unspecified abdominal pain: Secondary | ICD-10-CM

## 2022-01-17 DIAGNOSIS — N281 Cyst of kidney, acquired: Secondary | ICD-10-CM | POA: Diagnosis not present

## 2022-01-17 DIAGNOSIS — N400 Enlarged prostate without lower urinary tract symptoms: Secondary | ICD-10-CM | POA: Diagnosis not present

## 2022-01-17 MED ORDER — LANSOPRAZOLE 30 MG PO CPDR: 30.0000 mg | DELAYED_RELEASE_CAPSULE | Freq: Every day | ORAL | 1 refills | Status: DC

## 2022-01-18 ENCOUNTER — Other Ambulatory Visit: Payer: Self-pay | Admitting: Internal Medicine

## 2022-01-31 ENCOUNTER — Other Ambulatory Visit: Payer: Self-pay | Admitting: *Deleted

## 2022-01-31 MED ORDER — OMEPRAZOLE 40 MG PO CPDR: 40.0000 mg | DELAYED_RELEASE_CAPSULE | Freq: Every day | ORAL | 6 refills | Status: AC

## 2022-03-25 ENCOUNTER — Ambulatory Visit (INDEPENDENT_AMBULATORY_CARE_PROVIDER_SITE_OTHER): Payer: BC Managed Care – PPO | Admitting: Internal Medicine

## 2022-03-25 ENCOUNTER — Encounter: Payer: Self-pay | Admitting: Internal Medicine

## 2022-03-25 VITALS — BP 134/66 | HR 67 | Temp 98.2°F | Ht 72.0 in | Wt 167.6 lb

## 2022-03-25 DIAGNOSIS — K219 Gastro-esophageal reflux disease without esophagitis: Secondary | ICD-10-CM

## 2022-03-25 DIAGNOSIS — E785 Hyperlipidemia, unspecified: Secondary | ICD-10-CM

## 2022-03-25 DIAGNOSIS — R000 Tachycardia, unspecified: Secondary | ICD-10-CM

## 2022-03-25 MED ORDER — LORATADINE 10 MG PO TABS: 10.0000 mg | ORAL_TABLET | Freq: Every day | ORAL | 0 refills | Status: AC

## 2022-03-25 MED ORDER — ATORVASTATIN CALCIUM 40 MG PO TABS: 40.0000 mg | ORAL_TABLET | Freq: Every day | ORAL | 0 refills | Status: DC

## 2022-03-25 MED ORDER — METOPROLOL SUCCINATE ER 25 MG PO TB24: 25.0000 mg | ORAL_TABLET | Freq: Two times a day (BID) | ORAL | 0 refills | Status: DC

## 2022-03-25 NOTE — Assessment & Plan Note (Signed)
-   The patient's GERD is stable on medication.  - Instructed the patient to avoid eating spicy and acidic foods, as well as foods high in fat. - Instructed the patient to avoid eating large meals or meals 2-3 hours prior to sleeping. 

## 2022-03-25 NOTE — Assessment & Plan Note (Signed)
Hypercholesterolemia  I advised the patient to follow Mediterranean diet This diet is rich in fruits vegetables and whole grain, and This diet is also rich in fish and lean meat Patient should also eat a handful of almonds or walnuts daily Recent heart study indicated that average follow-up on this kind of diet reduces the cardiovascular mortality by 50 to 70%== 

## 2022-03-25 NOTE — Assessment & Plan Note (Signed)
Stable at the present time. 

## 2022-03-25 NOTE — Progress Notes (Signed)
Skin gram-negative  Established Patient Office Visit  Subjective:  Patient ID: Austin Copeland, male    DOB: [birth year]  Age: 66 y.o. MRN: [id number]  CC:  Chief Complaint  Patient presents with   Flank Pain    Comes and goes every now and then, found nothing with testing.     Flank Pain    Austin Copeland presents for bp check  Rightup , he lost his med in wash  dc       Social History   Socioeconomic History   Marital status: Single    Spouse name: Not on file   Number of children: Not on file   Years of education: Not on file   Highest education level: Not on file  Occupational History   Not on file  Tobacco Use   Smoking status: Former    Types: Cigarettes    Quit date: 03/2021    Years since quitting: 0.9   Smokeless tobacco: Never  Vaping Use   Vaping Use: Never used  Substance and Sexual Activity   Alcohol use: Never   Drug use: Never   Sexual activity: Not on file  Other Topics Concern   Not on file  Social History Narrative   Not on file   Social Determinants of Health   Financial Resource Strain: Not on file  Food Insecurity: Not on file  Transportation Needs: Not on file  Physical Activity: Not on file  Stress: Not on file  Social Connections: Not on file  Intimate Partner Violence: Not on file     Current Outpatient Medications:    aspirin EC 81 MG tablet, Take 1 tablet (81 mg total) by mouth daily. Swallow whole., Disp: 90 tablet, Rfl: 3   omeprazole (PRILOSEC) 40 MG capsule, Take 1 capsule (40 mg total) by mouth daily., Disp: 30 capsule, Rfl: 6   atorvastatin (LIPITOR) 40 MG tablet, Take 1 tablet (40 mg total) by mouth daily., Disp: 30 tablet, Rfl: 0   loratadine (CLARITIN) 10 MG tablet, Take 1 tablet (10 mg total) by mouth daily., Disp: 90 tablet, Rfl: 0   metoprolol succinate (TOPROL-XL) 25 MG 24 hr tablet, Take 1 tablet (25 mg total) by mouth 2 (two) times daily., Disp: 180 tablet, Rfl: 0   No Known Allergies  ROS Review of Systems   Constitutional: Negative.   HENT: Negative.    Eyes: Negative.   Respiratory: Negative.    Cardiovascular: Negative.   Gastrointestinal: Negative.   Endocrine: Negative.   Genitourinary:  Positive for flank pain.  Skin: Negative.   Allergic/Immunologic: Negative.   Neurological: Negative.   Hematological: Negative.   Psychiatric/Behavioral: Negative.    All other systems reviewed and are negative.     Objective:    Physical Exam Vitals reviewed.  Constitutional:      Appearance: Normal appearance.  HENT:     Mouth/Throat:     Mouth: Mucous membranes are moist.  Eyes:     Pupils: Pupils are equal, round, and reactive to light.  Neck:     Vascular: No carotid bruit.  Cardiovascular:     Rate and Rhythm: Normal rate and regular rhythm.     Pulses: Normal pulses.     Heart sounds: Normal heart sounds.  Pulmonary:     Effort: Pulmonary effort is normal.     Breath sounds: Normal breath sounds.  Abdominal:     General: Bowel sounds are normal.     Palpations: Abdomen is soft. There is no hepatomegaly,  splenomegaly or mass.     Tenderness: There is no abdominal tenderness.     Hernia: No hernia is present.  Musculoskeletal:     Cervical back: Neck supple.     Right lower leg: No edema.     Left lower leg: No edema.  Skin:    Findings: No rash.  Neurological:     Mental Status: He is alert and oriented to person, place, and time.     Motor: No weakness.  Psychiatric:        Mood and Affect: Mood normal.        Behavior: Behavior normal.     BP 134/66   Pulse 67   Temp 98.2 F (36.8 C) (Temporal)   Ht 6' (1.829 m)   Wt 167 lb 9.6 oz (76 kg)   SpO2 99%   BMI 22.73 kg/m  Wt Readings from Last 3 Encounters:  03/25/22 167 lb 9.6 oz (76 kg)  01/15/22 170 lb 3.2 oz (77.2 kg)  12/25/21 170 lb 6.4 oz (77.3 kg)     Health Maintenance Due  Topic Date Due   COVID-19 Vaccine (1) Never done   Pneumonia Vaccine 25+ Years old (1 - PCV) Never done   Hepatitis C  Screening  Never done   COLONOSCOPY (Pts 45-52yrs Insurance coverage will need to be confirmed)  Never done   Zoster Vaccines- Shingrix (1 of 2) Never done   INFLUENZA VACCINE  Never done    There are no preventive care reminders to display for this patient.  No results found for: "TSH" Lab Results  Component Value Date   WBC 7.5 04/08/2021   HGB 14.3 04/08/2021   HCT 43.8 04/08/2021   MCV 86.4 04/08/2021   PLT 266 04/08/2021   Lab Results  Component Value Date   NA 137 04/08/2021   K 3.7 04/08/2021   CO2 27 04/08/2021   GLUCOSE 113 (H) 04/08/2021   BUN 14 04/08/2021   CREATININE 1.10 04/08/2021   CALCIUM 9.4 04/08/2021   ANIONGAP 8 04/08/2021   Lab Results  Component Value Date   CHOL 181 05/18/2021   Lab Results  Component Value Date   HDL 68 05/18/2021   Lab Results  Component Value Date   LDLCALC 103 (H) 05/18/2021   Lab Results  Component Value Date   TRIG 50 05/18/2021   Lab Results  Component Value Date   CHOLHDL 2.7 05/18/2021   No results found for: "HGBA1C"    Assessment & Plan:   Problem List Items Addressed This Visit       Digestive   Gastroesophageal reflux disease without esophagitis    - The patient's GERD is stable on medication.  - Instructed the patient to avoid eating spicy and acidic foods, as well as foods high in fat. - Instructed the patient to avoid eating large meals or meals 2-3 hours prior to sleeping.        Other   Dyslipidemia - Primary    Hypercholesterolemia  I advised the patient to follow Mediterranean diet This diet is rich in fruits vegetables and whole grain, and This diet is also rich in fish and lean meat Patient should also eat a handful of almonds or walnuts daily Recent heart study indicated that average follow-up on this kind of diet reduces the cardiovascular mortality by 50 to 70%==      Relevant Medications   atorvastatin (LIPITOR) 40 MG tablet   Tachycardia    Stable at the present  time       Relevant Medications   atorvastatin (LIPITOR) 40 MG tablet   loratadine (CLARITIN) 10 MG tablet   metoprolol succinate (TOPROL-XL) 25 MG 24 hr tablet    Meds ordered this encounter  Medications   atorvastatin (LIPITOR) 40 MG tablet    Sig: Take 1 tablet (40 mg total) by mouth daily.    Dispense:  30 tablet    Refill:  0    Patient lost, ok to fill.   loratadine (CLARITIN) 10 MG tablet    Sig: Take 1 tablet (10 mg total) by mouth daily.    Dispense:  90 tablet    Refill:  0    Patient lost, ok to fill.   metoprolol succinate (TOPROL-XL) 25 MG 24 hr tablet    Sig: Take 1 tablet (25 mg total) by mouth 2 (two) times daily.    Dispense:  180 tablet    Refill:  0    Patient lost, ok to fill.  Patient complaining of pain in the left side of the abdomen, abdominal CT was okay.  Liver spleen is not enlarged on physical examination no hernias noted  Follow-up: No follow-ups on file.    Corky Downs, MD

## 2022-05-20 ENCOUNTER — Encounter: Payer: Self-pay | Admitting: Cardiology

## 2022-05-20 ENCOUNTER — Ambulatory Visit: Payer: BC Managed Care – PPO | Attending: Cardiology | Admitting: Cardiology

## 2022-05-20 ENCOUNTER — Other Ambulatory Visit
Admission: RE | Admit: 2022-05-20 | Discharge: 2022-05-20 | Disposition: A | Discharge status: Home / Self Care | Payer: BC Managed Care – PPO | Source: Ambulatory Visit | Attending: Cardiology | Admitting: Cardiology

## 2022-05-20 VITALS — BP 138/86 | HR 57 | Ht 72.0 in | Wt 163.8 lb

## 2022-05-20 DIAGNOSIS — I10 Essential (primary) hypertension: Secondary | ICD-10-CM | POA: Diagnosis not present

## 2022-05-20 DIAGNOSIS — I2510 Atherosclerotic heart disease of native coronary artery without angina pectoris: Secondary | ICD-10-CM | POA: Diagnosis not present

## 2022-05-20 LAB — LIPID PANEL
Cholesterol: 162 mg/dL (ref 0–200)
HDL: 57 mg/dL (ref >40)
LDL Cholesterol: 91 mg/dL (ref 0–99)
Total CHOL/HDL Ratio: 2.8 RATIO
Triglycerides: 72 mg/dL (ref <150)
VLDL: 14 mg/dL (ref 0–40)

## 2022-05-20 NOTE — Progress Notes (Signed)
Cardiology Office Note:    Date:  05/20/2022   ID:  Austin Copeland, DOB August 16, 1955, MRN [id number]  PCP:  Cletis Athens, MD   [facility] Providers Cardiologist:  Kate Sable, MD     Referring MD: Cletis Athens, MD   Chief Complaint  Patient presents with   Follow-up    12 month f/u, had event end of November    History of Present Illness:    Austin Copeland is a 67 y.o. male with a hx of nonobstructive CAD (Coronary CTA 04/2021 mild nonobstructive LAD and RCA disease),  GERD, former smoker x30+ years who presents for follow-up.   He rarely has chest pressure, occurring after a stressful incident such as a close family passing.  His last symptoms of chest pressure over 6 months ago.  Tolerating current medications as prescribed.  Takes omeprazole for GERD.  No new cardiac concerns at this time.   Prior notes Coronary CTA 04/2021 mild nonobstructive LAD and RCA disease, calcium score 164. Echocardiogram 04/2021, EF 50 to 55%.     History reviewed. No pertinent past medical history.  History reviewed. No pertinent surgical history.  Current Medications: No outpatient medications have been marked as taking for the 05/20/22 encounter (Office Visit) with Kate Sable, MD.     Allergies:   Patient has no known allergies.   Social History   Socioeconomic History   Marital status: Single    Spouse name: Not on file   Number of children: Not on file   Years of education: Not on file   Highest education level: Not on file  Occupational History   Not on file  Tobacco Use   Smoking status: Every Day    Packs/day: 0.10    Types: Cigarettes    Last attempt to quit: 03/2021    Years since quitting: 1.1   Smokeless tobacco: Never   Tobacco comments:    2-3 cigarettes per day   Vaping Use   Vaping Use: Never used  Substance and Sexual Activity   Alcohol use: Never   Drug use: Never   Sexual activity: Not on file  Other Topics Concern   Not on file  Social History  Narrative   Not on file   Social Determinants of Health   Financial Resource Strain: Not on file  Food Insecurity: Not on file  Transportation Needs: Not on file  Physical Activity: Not on file  Stress: Not on file  Social Connections: Not on file     Family History: The patient's family history is not on file.  ROS:   Please see the history of present illness.     All other systems reviewed and are negative.  EKGs/Labs/Other Studies Reviewed:    The following studies were reviewed today:   EKG:  EKG is ordered today.  EKG shows sinus bradycardia, otherwise normal, heart rate 57  Recent Labs: No results found for requested labs within last 365 days.  Recent Lipid Panel    Component Value Date/Time   CHOL 181 05/18/2021 0756   TRIG 50 05/18/2021 0756   HDL 68 05/18/2021 0756   CHOLHDL 2.7 05/18/2021 0756   LDLCALC 103 (H) 05/18/2021 0756     Risk Assessment/Calculations:          Physical Exam:    VS:  BP 138/86 (BP Location: Left Arm, Patient Position: Sitting, Cuff Size: Normal)   Pulse (!) 57   Ht 6' (1.829 m)   Wt 163 lb 12.8 oz (74.3  kg)   SpO2 99%   BMI 22.22 kg/m     Wt Readings from Last 3 Encounters:  05/20/22 163 lb 12.8 oz (74.3 kg)  03/25/22 167 lb 9.6 oz (76 kg)  01/15/22 170 lb 3.2 oz (77.2 kg)     GEN:  Well nourished, well developed in no acute distress HEENT: Normal NECK: No JVD; No carotid bruits CARDIAC: RRR, no murmurs, rubs, gallops RESPIRATORY:  Clear to auscultation without rales, wheezing or rhonchi  ABDOMEN: Soft, non-tender, non-distended MUSCULOSKELETAL:  No edema; No deformity  SKIN: Warm and dry NEUROLOGIC:  Alert and oriented x 3 PSYCHIATRIC:  Normal affect   ASSESSMENT:    1. Coronary artery disease involving native coronary artery of native heart without angina pectoris   2. Primary hypertension     PLAN:    In order of problems listed above:  Mild nonobstructive CAD in LAD and RCA.  Denies chest pain.   EF normal.  Continue aspirin, Lipitor 40 mg daily.   Hypertension, BP controlled.  Continue Toprol-XL 25 mg daily.  Follow-up yearly.     Medication Adjustments/Labs and Tests Ordered: Current medicines are reviewed at length with the patient today.  Concerns regarding medicines are outlined above.  Orders Placed This Encounter  Procedures   Lipid panel   EKG 12-Lead   No orders of the defined types were placed in this encounter.   Patient Instructions  Medication Instructions:   Your physician recommends that you continue on your current medications as directed. Please refer to the Current Medication list given to you today.  *If you need a refill on your cardiac medications before your next appointment, please call your pharmacy*   Lab Work:  Your physician recommends you go to the medical mall for blood work.   If you have labs (blood work) drawn today and your tests are completely normal, you will receive your results only by: [place] (if you have MyChart) OR A paper copy in the mail If you have any lab test that is abnormal or we need to change your treatment, we will call you to review the results.   Testing/Procedures:  None Ordered   Follow-Up: At [facility], you and your health needs are our priority.  As part of our continuing mission to provide you with exceptional heart care, we have created designated Provider Care Teams.  These Care Teams include your primary Cardiologist (physician) and Advanced Practice Providers (APPs -  Physician Assistants and Nurse Practitioners) who all work together to provide you with the care you need, when you need it.  We recommend signing up for the patient portal called "MyChart".  Sign up information is provided on this After Visit Summary.  MyChart is used to connect with patients for Virtual Visits (Telemedicine).  Patients are able to view lab/test results, encounter notes, upcoming appointments, etc.   Non-urgent messages can be sent to your provider as well.   To learn more about what you can do with MyChart, go to [email or web address].    Your next appointment:   12 month(s)  Provider:   You may see Kate Sable, MD or one of the following Advanced Practice Providers on your designated Care Team:   Murray Hodgkins, NP Christell Faith, PA-C Cadence Kathlen Mody, PA-C Gerrie Nordmann, NP   Signed, Kate Sable, MD  05/20/2022 10:11 AM    [place]

## 2022-05-20 NOTE — Patient Instructions (Signed)
Medication Instructions:   Your physician recommends that you continue on your current medications as directed. Please refer to the Current Medication list given to you today.  *If you need a refill on your cardiac medications before your next appointment, please call your pharmacy*   Lab Work:  Your physician recommends you go to the medical mall for blood work.   If you have labs (blood work) drawn today and your tests are completely normal, you will receive your results only by: [place] (if you have MyChart) OR A paper copy in the mail If you have any lab test that is abnormal or we need to change your treatment, we will call you to review the results.   Testing/Procedures:  None Ordered   Follow-Up: At [facility], you and your health needs are our priority.  As part of our continuing mission to provide you with exceptional heart care, we have created designated Provider Care Teams.  These Care Teams include your primary Cardiologist (physician) and Advanced Practice Providers (APPs -  Physician Assistants and Nurse Practitioners) who all work together to provide you with the care you need, when you need it.  We recommend signing up for the patient portal called "MyChart".  Sign up information is provided on this After Visit Summary.  MyChart is used to connect with patients for Virtual Visits (Telemedicine).  Patients are able to view lab/test results, encounter notes, upcoming appointments, etc.  Non-urgent messages can be sent to your provider as well.   To learn more about what you can do with MyChart, go to [email or web address].    Your next appointment:   12 month(s)  Provider:   You may see Kate Sable, MD or one of the following Advanced Practice Providers on your designated Care Team:   Murray Hodgkins, NP Christell Faith, PA-C Cadence Kathlen Mody, PA-C Gerrie Nordmann, NP

## 2022-07-10 ENCOUNTER — Other Ambulatory Visit: Payer: Self-pay

## 2022-07-10 DIAGNOSIS — R000 Tachycardia, unspecified: Secondary | ICD-10-CM

## 2022-07-10 MED ORDER — ATORVASTATIN CALCIUM 40 MG PO TABS: 40.0000 mg | ORAL_TABLET | Freq: Every day | ORAL | 3 refills | Status: DC

## 2022-12-02 ENCOUNTER — Emergency Department
Admission: EM | Admit: 2022-12-02 | Discharge: 2022-12-02 | Disposition: A | Discharge status: Home / Self Care | Payer: BC Managed Care – PPO | Attending: Emergency Medicine | Admitting: Emergency Medicine

## 2022-12-02 ENCOUNTER — Encounter: Payer: Self-pay | Admitting: Emergency Medicine

## 2022-12-02 ENCOUNTER — Emergency Department: Payer: BC Managed Care – PPO

## 2022-12-02 ENCOUNTER — Other Ambulatory Visit: Payer: Self-pay

## 2022-12-02 DIAGNOSIS — R000 Tachycardia, unspecified: Secondary | ICD-10-CM | POA: Diagnosis present

## 2022-12-02 DIAGNOSIS — I471 Supraventricular tachycardia, unspecified: Secondary | ICD-10-CM | POA: Diagnosis not present

## 2022-12-02 DIAGNOSIS — R778 Other specified abnormalities of plasma proteins: Secondary | ICD-10-CM | POA: Insufficient documentation

## 2022-12-02 LAB — CBC
HCT: 46.9 % (ref 39.0–52.0)
Hemoglobin: 15.1 g/dL (ref 13.0–17.0)
MCH: 27.6 pg (ref 26.0–34.0)
MCHC: 32.2 g/dL (ref 30.0–36.0)
MCV: 85.6 fL (ref 80.0–100.0)
Platelets: 217 10*3/uL (ref 150–400)
RBC: 5.48 MIL/uL (ref 4.22–5.81)
RDW: 13.6 % (ref 11.5–15.5)
WBC: 6.7 10*3/uL (ref 4.0–10.5)
nRBC: 0 % (ref 0.0–0.2)

## 2022-12-02 LAB — TROPONIN I (HIGH SENSITIVITY)
Troponin I (High Sensitivity): 24 ng/L — ABNORMAL HIGH (ref <18)
Troponin I (High Sensitivity): 68 ng/L — ABNORMAL HIGH (ref <18)

## 2022-12-02 LAB — BASIC METABOLIC PANEL
Anion gap: 8 (ref 5–15)
BUN: 16 mg/dL (ref 8–23)
CO2: 25 mmol/L (ref 22–32)
Calcium: 9.5 mg/dL (ref 8.9–10.3)
Chloride: 105 mmol/L (ref 98–111)
Creatinine, Ser: 1.29 mg/dL — ABNORMAL HIGH (ref 0.61–1.24)
GFR, Estimated: >60 mL/min (ref >60)
Glucose, Bld: 151 mg/dL — ABNORMAL HIGH (ref 70–99)
Potassium: 4 mmol/L (ref 3.5–5.1)
Sodium: 138 mmol/L (ref 135–145)

## 2022-12-02 MED ORDER — DILTIAZEM HCL 25 MG/5ML IV SOLN
INTRAVENOUS | Status: AC
  Filled 2022-12-02: qty 5

## 2022-12-02 MED ORDER — DILTIAZEM HCL 25 MG/5ML IV SOLN
15.0000 mg | Freq: Once | INTRAVENOUS | Status: AC
  Administered 2022-12-02: 15 mg via INTRAVENOUS

## 2022-12-02 MED ORDER — ADENOSINE 6 MG/2ML IV SOLN
6.0000 mg | Freq: Once | INTRAVENOUS | Status: AC
  Administered 2022-12-02: 6 mg via INTRAVENOUS

## 2022-12-02 NOTE — ED Triage Notes (Signed)
Patient to ED via [facility] for high heart rate- sent by PCP. Took regular medications this morning. States he has a "small blockage but nothing to be concerned about." Denies CP but states he feel uncomfortable.

## 2022-12-02 NOTE — ED Provider Notes (Signed)
[facility] Provider Note    Event Date/Time   First MD Initiated Contact with Patient 12/02/22 1213     (approximate)   History   Tachycardia   HPI  Austin Copeland is a 67 y.o. male who presents to the urgency department today because of concerns for fast heart rate.  The patient says that this morning he felt a discomfort in his chest.  He says he has had similar symptoms in the past.  It sounds that he has been told it was related more to acid reflux. The patient tries to stay away from caffeine.      Physical Exam   Triage Vital Signs: ED Triage Vitals [12/02/22 1201]  Encounter Vitals Group     BP 131/86     Systolic BP Percentile      Diastolic BP Percentile      Pulse Rate (!) 156     Resp 18     Temp 98 F (36.7 C)     Temp Source Oral     SpO2 100 %     Weight 160 lb (72.6 kg)     Height 6' (1.829 m)     Head Circumference      Peak Flow      Pain Score 0     Pain Loc      Pain Education      Exclude from Growth Chart     Most recent vital signs: Vitals:   12/02/22 1201  BP: 131/86  Pulse: (!) 156  Resp: 18  Temp: 98 F (36.7 C)  SpO2: 100%   General: Awake, alert, oriented. CV:  Good peripheral perfusion. Tachycardia Resp:  Normal effort. Lungs clear. Abd:  No distention.    ED Results / Procedures / Treatments   Labs (all labs ordered are listed, but only abnormal results are displayed) Labs Reviewed  BASIC METABOLIC PANEL - Abnormal; Notable for the following components:      Result Value   Glucose, Bld 151 (*)    Creatinine, Ser 1.29 (*)    All other components within normal limits  TROPONIN I (HIGH SENSITIVITY) - Abnormal; Notable for the following components:   Troponin I (High Sensitivity) 24 (*)    All other components within normal limits  TROPONIN I (HIGH SENSITIVITY) - Abnormal; Notable for the following components:   Troponin I (High Sensitivity) 68 (*)    All other components within normal limits   CBC     EKG  I, Phineas Semen, attending physician, personally viewed and interpreted this EKG  EKG Time: 1234 Rate: 154 Rhythm: SVT Axis: normal Intervals: qtc 503 QRS: narrow, q waves v1, v2, v3 ST changes: no st elevation Impression: abnormal ekg  I, Phineas Semen, attending physician, personally viewed and interpreted this EKG  EKG Time: 1240 Rate: 63 Rhythm: sinus rhythm Axis: normal Intervals: qtc 346 QRS: narrow, q waves v1, v2, v3 ST changes: no st elevation Impression: abnormal ekg   RADIOLOGY I independently interpreted and visualized the CXR. My interpretation: No pneumonia Radiology interpretation:  IMPRESSION:  Hyperinflation.  Chronic changes.  No acute cardiopulmonary disease.      PROCEDURES:  Critical Care performed: Yes  CRITICAL CARE Performed by: Phineas Semen   Total critical care time: 30 minutes  Critical care time was exclusive of separately billable procedures and treating other patients.  Critical care was necessary to treat or prevent imminent or life-threatening deterioration.  Critical care was time spent personally  by me on the following activities: development of treatment plan with patient and/or surrogate as well as nursing, discussions with consultants, evaluation of patient's response to treatment, examination of patient, obtaining history from patient or surrogate, ordering and performing treatments and interventions, ordering and review of laboratory studies, ordering and review of radiographic studies, pulse oximetry and re-evaluation of patient's condition.   Procedures    MEDICATIONS ORDERED IN ED: Medications  adenosine (ADENOCARD) 6 MG/2ML injection 6 mg (6 mg Intravenous Given 12/02/22 1232)  diltiazem (CARDIZEM) injection 15 mg (15 mg Intravenous Given 12/02/22 1237)     IMPRESSION / MDM / ASSESSMENT AND PLAN / ED COURSE  I reviewed the triage vital signs and the nursing notes.                               Differential diagnosis includes, but is not limited to, sinus tachycardia, SVT, atrial fib with rvr  Patient's presentation is most consistent with acute presentation with potential threat to life or bodily function.   The patient is on the cardiac monitor to evaluate for evidence of arrhythmia and/or significant heart rate changes.  Patient presented to the emergency department today because of concerns for fast heart rate.  Patient states he has had similar symptoms happen to him in the past.  Initial EKG is concerning for SVT.  Patient was initially given 6 of adenosine which very temporarily broke his SVT.  We then tried diltiazem which did convert patient back to a sinus rhythm.  Patient did clinically feel better.  Blood work without concerning electrolyte abnormality.  Initial troponin slightly elevated.  Will plan on recheck.   Repeat troponin slightly more elevated.  However patient continues to feel much improved.  Denies any further chest pressure.  Did discuss briefly with Dr. Okey Dupre with cardiology.  At this time thought likely due to demand ischemia.  Will plan on discharging with close cardiology follow-up.   FINAL CLINICAL IMPRESSION(S) / ED DIAGNOSES   Final diagnoses:  SVT (supraventricular tachycardia)   Note:  This document was prepared using Dragon voice recognition software and may include unintentional dictation errors.    Phineas Semen, MD 12/02/22 [phone]

## 2022-12-02 NOTE — ED Notes (Signed)
EKG repeated, on zoll pads.

## 2022-12-02 NOTE — Discharge Instructions (Signed)
Please seek medical attention for any high fevers, chest pain, shortness of breath, change in behavior, persistent vomiting, bloody stool or any other new or concerning symptoms.  

## 2022-12-02 NOTE — ED Notes (Signed)
Pt ambulatory to treatment room, began feeling "off" around 1230am today, states has prior episodes of fast HR, saw PCP this AM who told him to come to ED. HR 149 on 12 lead, regular, ST versus SVT. Pt denies SOB, chest pain, dizziness. Skin dry.

## 2022-12-02 NOTE — ED Notes (Signed)
Pt converted to sinus rhythm. 2 more EKGs were obtained.

## 2022-12-02 NOTE — ED Notes (Signed)
Still in SVT.

## 2022-12-02 NOTE — ED Notes (Signed)
Pt was given adenosine, did convert momentarily but pt went back into SVT. EDP Goodman placing new orders for diltiazem push.

## 2022-12-02 NOTE — ED Notes (Signed)
EDP at bedside, pt being placed on zoll pads in preparation for IV adenosine.

## 2022-12-03 ENCOUNTER — Other Ambulatory Visit: Payer: Self-pay

## 2022-12-03 ENCOUNTER — Telehealth: Payer: Self-pay | Admitting: Internal Medicine

## 2022-12-03 DIAGNOSIS — I471 Supraventricular tachycardia, unspecified: Secondary | ICD-10-CM

## 2022-12-03 NOTE — Telephone Encounter (Signed)
Called pt and left a message to Schedule  Consult with lambert and See Dr Azucena Cecil as soon as possible

## 2022-12-12 ENCOUNTER — Other Ambulatory Visit: Payer: Self-pay

## 2022-12-12 ENCOUNTER — Emergency Department: Payer: BC Managed Care – PPO

## 2022-12-12 ENCOUNTER — Emergency Department
Admission: EM | Admit: 2022-12-12 | Discharge: 2022-12-12 | Disposition: A | Discharge status: Home / Self Care | Payer: BC Managed Care – PPO | Attending: Emergency Medicine | Admitting: Emergency Medicine

## 2022-12-12 DIAGNOSIS — I471 Supraventricular tachycardia, unspecified: Secondary | ICD-10-CM | POA: Insufficient documentation

## 2022-12-12 DIAGNOSIS — R002 Palpitations: Secondary | ICD-10-CM

## 2022-12-12 HISTORY — DX: Supraventricular tachycardia, unspecified: I47.10

## 2022-12-12 LAB — CBC
HCT: 44.4 % (ref 39.0–52.0)
Hemoglobin: 14.1 g/dL (ref 13.0–17.0)
MCH: 27.9 pg (ref 26.0–34.0)
MCHC: 31.8 g/dL (ref 30.0–36.0)
MCV: 87.9 fL (ref 80.0–100.0)
Platelets: 182 10*3/uL (ref 150–400)
RBC: 5.05 MIL/uL (ref 4.22–5.81)
RDW: 13.3 % (ref 11.5–15.5)
WBC: 5.4 10*3/uL (ref 4.0–10.5)
nRBC: 0 % (ref 0.0–0.2)

## 2022-12-12 LAB — BASIC METABOLIC PANEL
Anion gap: 5 (ref 5–15)
BUN: 14 mg/dL (ref 8–23)
CO2: 27 mmol/L (ref 22–32)
Calcium: 9.6 mg/dL (ref 8.9–10.3)
Chloride: 105 mmol/L (ref 98–111)
Creatinine, Ser: 1.13 mg/dL (ref 0.61–1.24)
GFR, Estimated: >60 mL/min (ref >60)
Glucose, Bld: 117 mg/dL — ABNORMAL HIGH (ref 70–99)
Potassium: 3.9 mmol/L (ref 3.5–5.1)
Sodium: 137 mmol/L (ref 135–145)

## 2022-12-12 LAB — MAGNESIUM: Magnesium: 2.4 mg/dL (ref 1.7–2.4)

## 2022-12-12 LAB — TROPONIN I (HIGH SENSITIVITY): Troponin I (High Sensitivity): 6 ng/L (ref <18)

## 2022-12-12 LAB — TSH: TSH: 0.489 u[IU]/mL (ref 0.350–4.500)

## 2022-12-12 NOTE — Discharge Instructions (Signed)
You were seen in the emergency department for an episode of tachycardia (fast heartbeat).  Your electrolytes were normal.  You will be called if your thyroid studies were abnormal.  Continue to follow-up with your primary care physician.  Call cardiology tomorrow and let them know you are seen in the emergency department and see if you can move up your appointment.  Try to decrease your nicotine use.  Avoid any caffeine.  Return to the emergency department if you have return of your symptoms.

## 2022-12-12 NOTE — ED Notes (Signed)
See triage note, pt reports here for fast heart rate. Was seen for same last week. Denies chest pain, reports feeling tired. States metoprolol increased, started new dosage today

## 2022-12-12 NOTE — ED Provider Notes (Signed)
[facility] Provider Note    Event Date/Time   First MD Initiated Contact with Patient 12/12/22 1503     (approximate)   History   Palpitations   HPI  Austin Copeland is a 67 y.o. male past medical history significant for SVT on metoprolol, tobacco use, who presents to the emergency department with palpitations.  Today was at work and felt like his heart was racing.  Denies any significant chest pain.  No recent nausea, vomiting or diarrhea.  Recently was evaluated in the emergency department last week for similar episode, increased his medication from metoprolol 25 mg to 100 mg and took his first dose this morning.  Does endorse significant amount of stress over the past week secondary to his niece's death.  Denies any alcohol use.  Denies any caffeine use or energy drink use.  No chest pain or shortness of breath.  Has a scheduled appointment with cardiology for middle of September.  Scheduled for stress test tomorrow.     Physical Exam   Triage Vital Signs: ED Triage Vitals  Encounter Vitals Group     BP 12/12/22 1329 (!) 132/91     Systolic BP Percentile --      Diastolic BP Percentile --      Pulse Rate 12/12/22 1329 (!) 138     Resp 12/12/22 1329 14     Temp 12/12/22 1329 97.9 F (36.6 C)     Temp Source 12/12/22 1329 Oral     SpO2 12/12/22 1329 100 %     Weight 12/12/22 1326 158 lb 14.4 oz (72.1 kg)     Height --      Head Circumference --      Peak Flow --      Pain Score 12/12/22 1326 0     Pain Loc --      Pain Education --      Exclude from Growth Chart --     Most recent vital signs: Vitals:   12/12/22 1445 12/12/22 1530  BP: 117/88 119/78  Pulse: (!) 135 (!) 58  Resp: 13 14  Temp:    SpO2: 99% 100%    Physical Exam Constitutional:      Appearance: He is well-developed.  HENT:     Head: Atraumatic.  Eyes:     Conjunctiva/sclera: Conjunctivae normal.     Pupils: Pupils are equal, round, and reactive to light.   Cardiovascular:     Rate and Rhythm: Regular rhythm.  Pulmonary:     Effort: No respiratory distress.  Abdominal:     Tenderness: There is no abdominal tenderness.  Musculoskeletal:     Cervical back: Normal range of motion.     Right lower leg: No edema.     Left lower leg: No edema.  Skin:    General: Skin is warm.     Capillary Refill: Capillary refill takes less than 2 seconds.  Neurological:     Mental Status: He is alert. Mental status is at baseline.     IMPRESSION / MDM / ASSESSMENT AND PLAN / ED COURSE  I reviewed the triage vital signs and the nursing notes.  Differential diagnosis including SVT, atrial tachycardia, electrolyte abnormality, hypothyroidism, stress-induced, ACS  EKG  I, Corena Herter, the attending physician, personally viewed and interpreted this ECG.  Initial EKG consistent with SVT, narrow complex tachycardia with a heart rate of 135.  No P waves noted.  Otherwise normal intervals.   Rate: Normal  Rhythm:  Normal sinus  Axis: Normal  Intervals: Normal  ST&T Change: None  No tachycardic or bradycardic dysrhythmias while on cardiac telemetry.  RADIOLOGY my interpretation of imaging: Chest x-ray no signs of pneumonia or pulmonary edema  LABS (all labs ordered are listed, but only abnormal results are displayed) Labs interpreted as -    Labs Reviewed  BASIC METABOLIC PANEL - Abnormal; Notable for the following components:      Result Value   Glucose, Bld 117 (*)    All other components within normal limits  CBC  MAGNESIUM  TSH  TROPONIN I (HIGH SENSITIVITY)  TROPONIN I (HIGH SENSITIVITY)     MDM  On chart review patient had a recent ED visit for an episode of SVT, was given adenosine without conversion, was given diltiazem and converted into normal sinus rhythm.  Increase metoprolol dose from 25 to 100 mg.  Repeat lab work today was within normal limits.  Troponin within normal limits.  Adding on TSH and magnesium level.  No signs  of pneumonia.  While in the emergency department patient spontaneously converted back into normal sinus rhythm.  Repeat EKG with normal sinus rhythm.  Normal intervals and no chamber enlargement.  Does have ST elevation inferiorly, appears to be benign early repolarization.  No signs of reciprocal change.  Isolated T wave that is inverted to aVL.  No signs of acute ischemia or dysrhythmia.  No active chest pain at this time.  Discussed ongoing and close follow-up with his cardiologist.  Discussed decreasing nicotine use.  Given return precautions if his palpitations returns that he needs to return to the emergency department.  Patient expressed understanding.     PROCEDURES:  Critical Care performed: No  Procedures  Patient's presentation is most consistent with acute complicated illness / injury requiring diagnostic workup.   MEDICATIONS ORDERED IN ED: Medications - No data to display  FINAL CLINICAL IMPRESSION(S) / ED DIAGNOSES   Final diagnoses:  Palpitations  SVT (supraventricular tachycardia)     Rx / DC Orders   ED Discharge Orders          Ordered    Ambulatory referral to Cardiology       Comments: If you have not heard from the Cardiology office within the next 72 hours please call [phone].   12/12/22 1540             Note:  This document was prepared using Dragon voice recognition software and may include unintentional dictation errors.   Corena Herter, MD 12/12/22 1540

## 2022-12-12 NOTE — ED Triage Notes (Signed)
Pt saw Dr Derrill Kay last week for SVT. Pt cardiologist increased his metoprolol from 25mg  to 100mg  which he started this morning. Pt said he started feeling palpitations at 1030 this morning and hasn't improved at all. Pt endorses feeling fatigued. Denies CP.

## 2023-01-08 IMAGING — CR DG CHEST 2V
2 series · 2 of 2 positions shown · non-contrast
Comparison: Chest radiograph dated 11/17/2017

CLINICAL DATA: Chest pain.

EXAM:
CHEST - 2 VIEW

[chest pa]
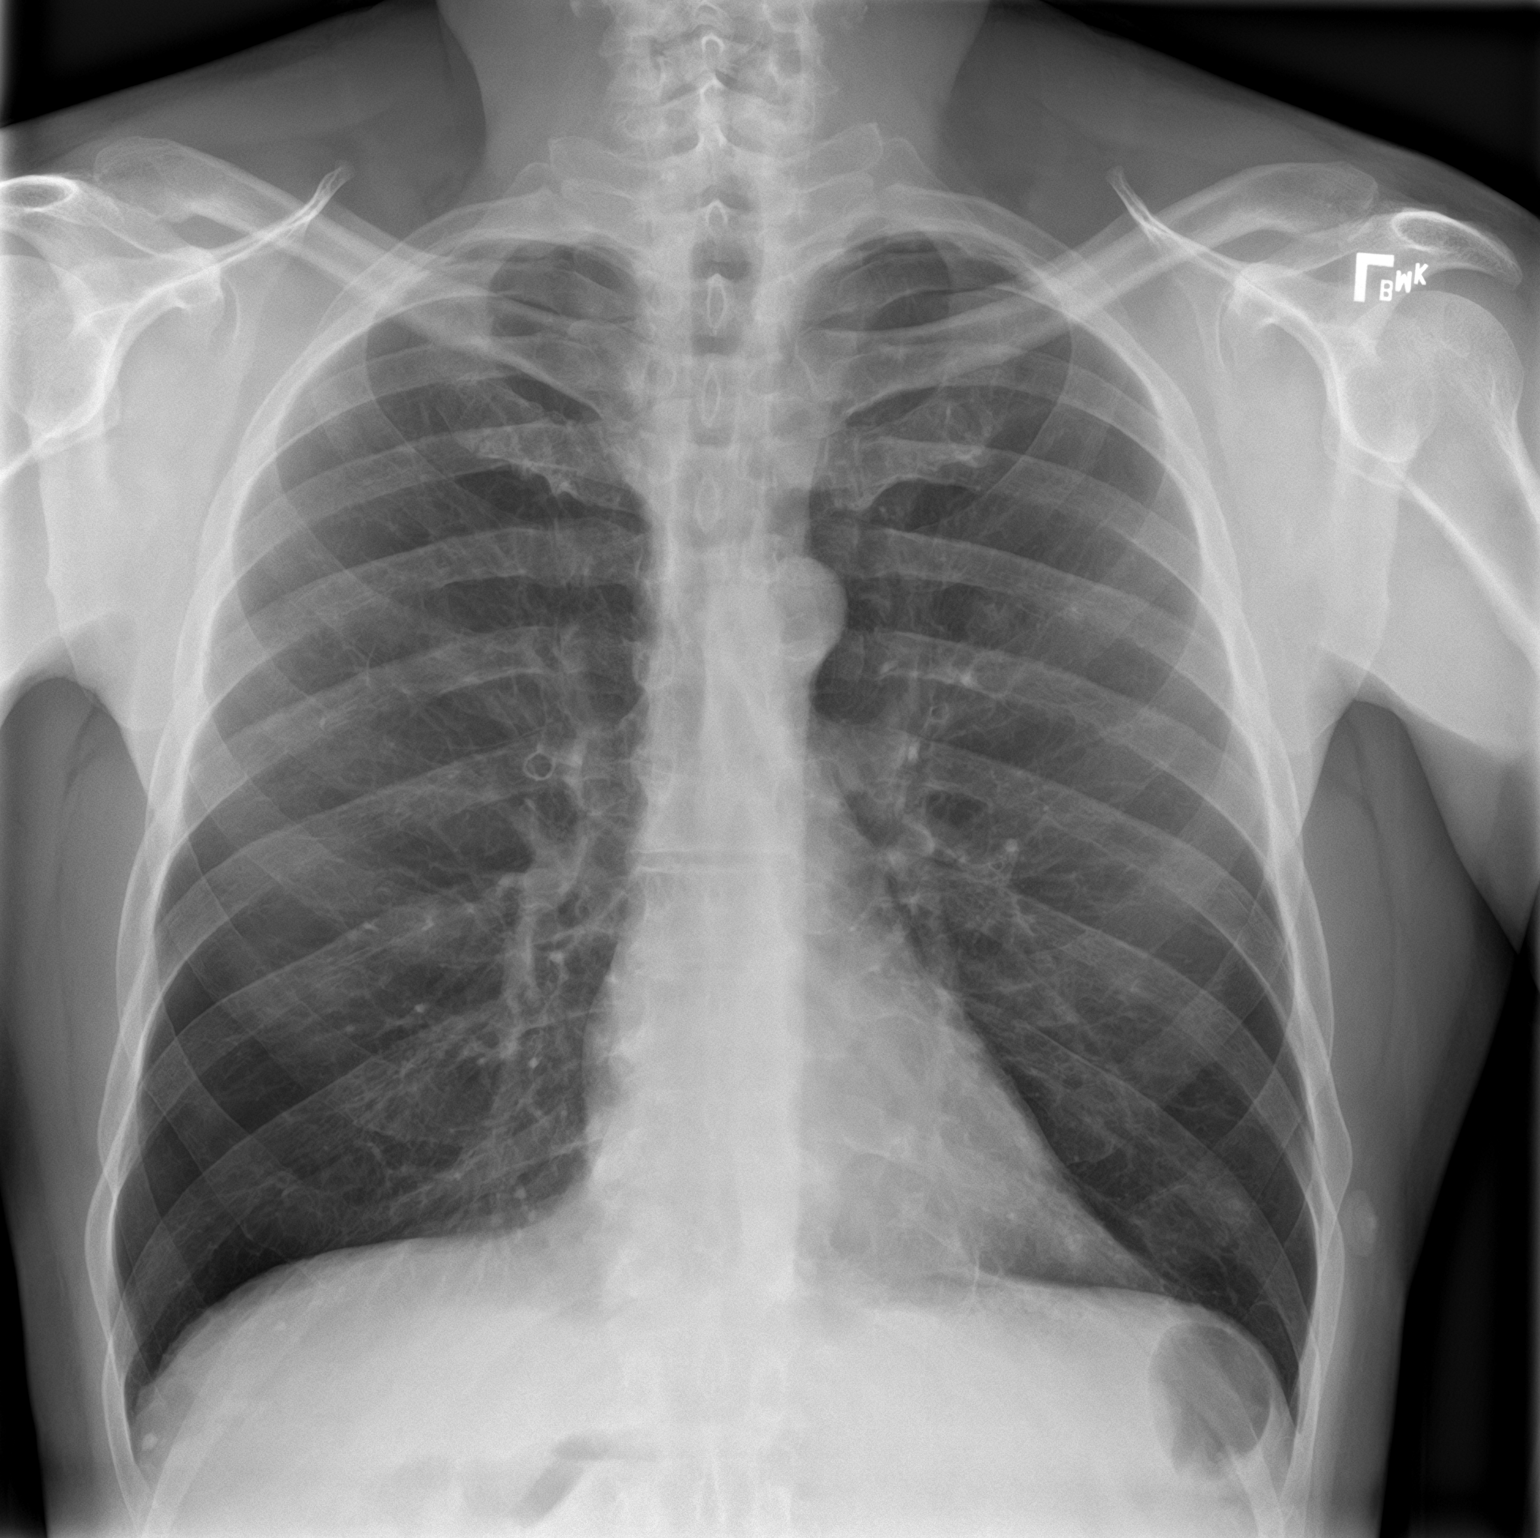

[chest lat]
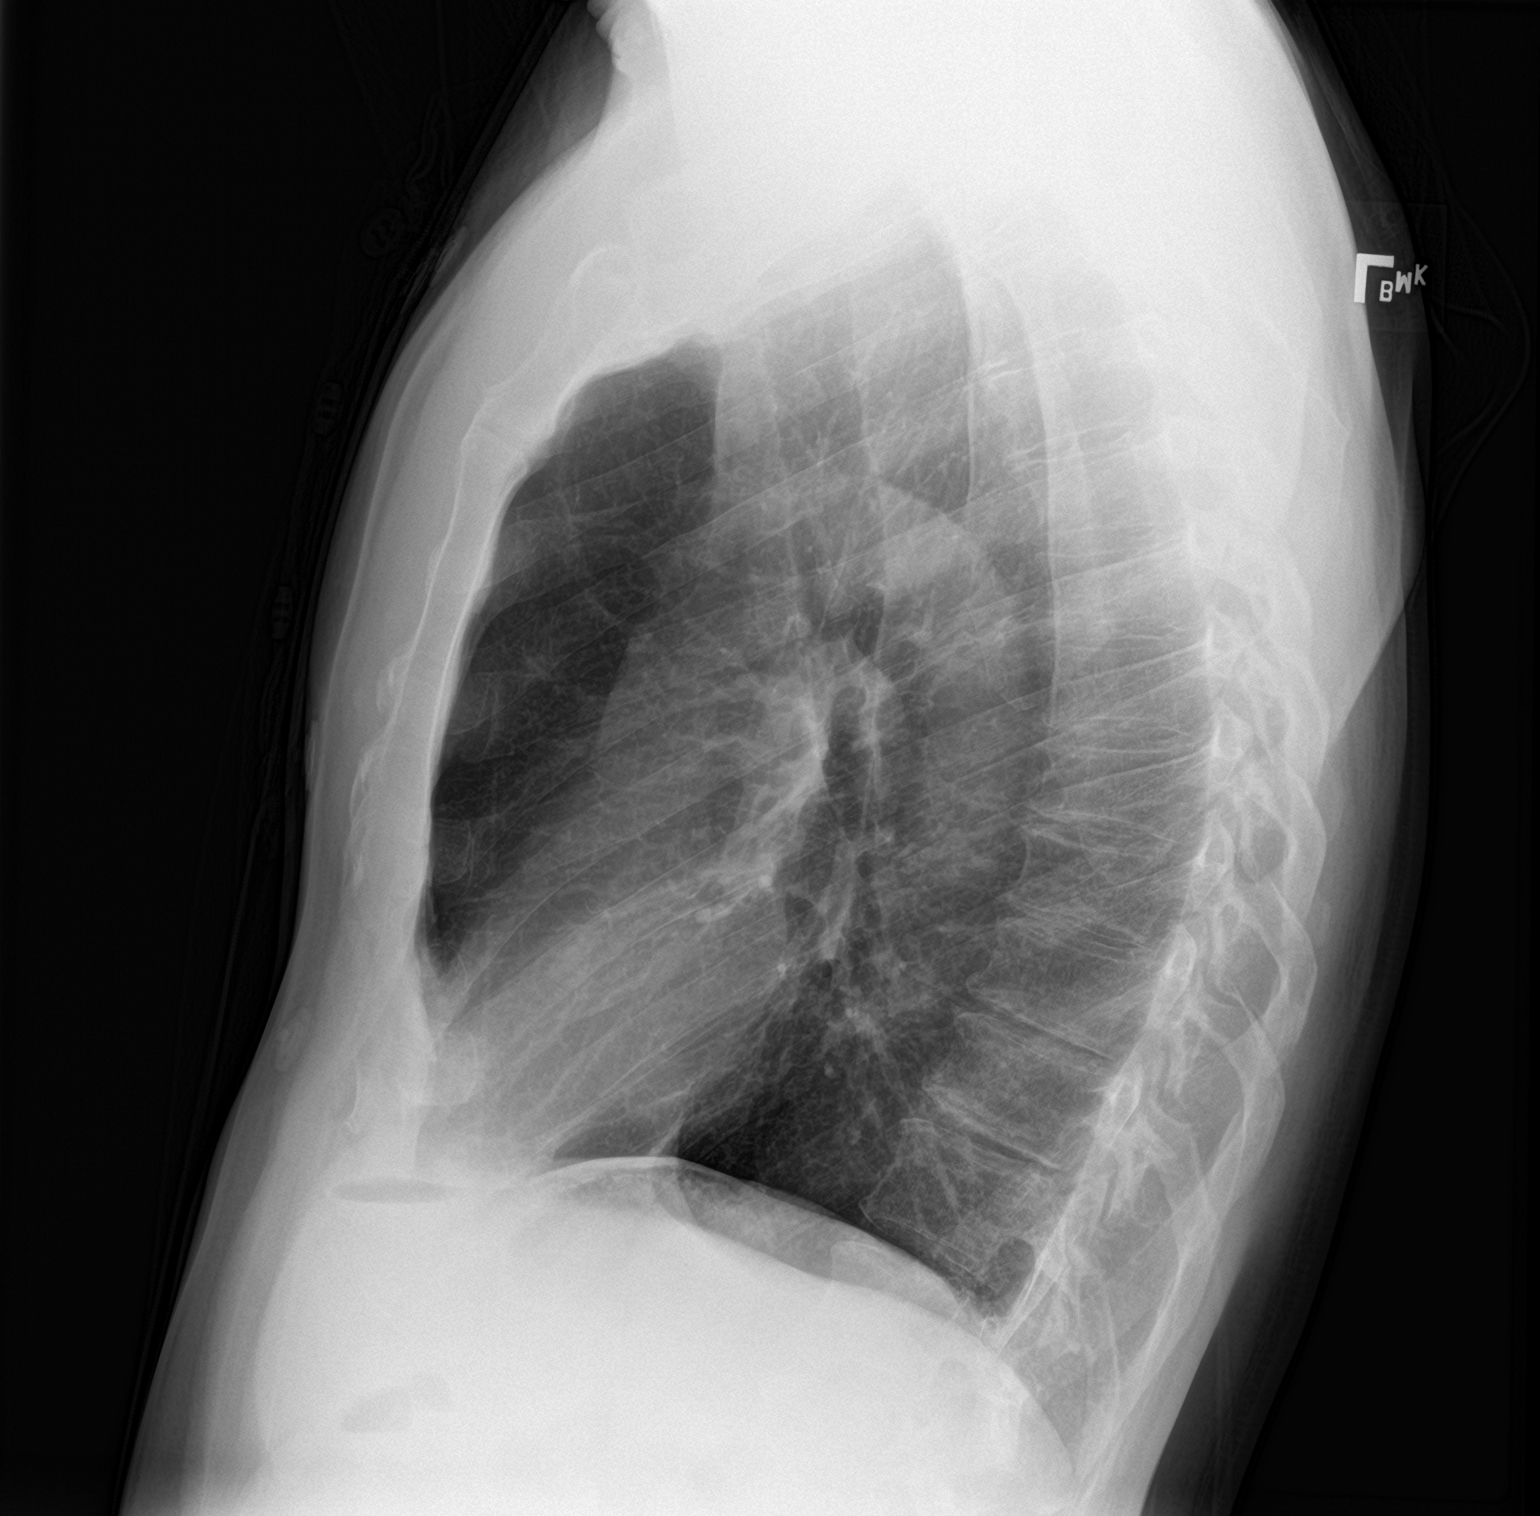

[2 of 2 positions shown; findings below may reference images not displayed]

FINDINGS: The heart size and mediastinal contours are within normal limits.
Both lungs are clear. The visualized skeletal structures are
unremarkable.
IMPRESSION: No active cardiopulmonary disease.

## 2023-01-13 ENCOUNTER — Ambulatory Visit: Payer: BC Managed Care – PPO | Admitting: Cardiology

## 2023-01-19 NOTE — Progress Notes (Signed)
  Electrophysiology Office Note:    Date:  01/21/2023   ID:  Austin Copeland, DOB May 10, 1955, MRN [id number]  [facility] Cardiologist:  Debbe Odea, MD  [facility] Electrophysiologist:  Lanier Prude, MD   Referring MD: Yvonne Kendall, MD   Chief Complaint: SVT  History of Present Illness:    Austin Copeland is a 67 y.o. malewho I am seeing today for an evaluation of SVTat the request of Dr End.  The patient was last seen by Dr Azucena Cecil on 05/20/2022..  The patient has a medical history that includes GERD, tobacco abuse, nonobstructive CAD.Marland Kitchen  He was in the ER 12/02/2022 and 12/12/2022 with symptomatic SVT. Treated with IV adenosine which briefly broke the SVT. He then went back into SVT and was treated with IV dilt which broke the SVT more durably.   Today he tells me he has had episodes of SVT since the 1990s.  He is symptomatic with palpitations during the episodes.  He would like to avoid medication use if at all possible.  He is in the process of transitioning from his job and insurance.            Their past medical, social and family history was reveiwed.   ROS:   Please see the history of present illness.    All other systems reviewed and are negative.  EKGs/Labs/Other Studies Reviewed:    The following studies were reviewed today:  ECGs as above.       Physical Exam:    VS:  BP 138/78   Pulse 63   Ht 6' (1.829 m)   Wt 154 lb (69.9 kg)   SpO2 99%   BMI 20.89 kg/m     Wt Readings from Last 3 Encounters:  01/21/23 154 lb (69.9 kg)  12/12/22 158 lb 14.4 oz (72.1 kg)  12/02/22 160 lb (72.6 kg)     GEN:  Well nourished, well developed in no acute distress CARDIAC: RRR, no murmurs, rubs, gallops RESPIRATORY:  Clear to auscultation without rales, wheezing or rhonchi       ASSESSMENT AND PLAN:    1. SVT (supraventricular tachycardia)   2. Primary hypertension     #SVT Unclear mechanism. Discussed possible diagnoses today in clinic  including AVNRT vs AT vs less likely AF/AFL or AVRT. Discussed treatment strategies including medications and EPS/Ablation.  Therapeutic strategies for supraventricular tachycardia including medicine and ablation were discussed in detail with the patient today. Risk, benefits, and alternatives to EP study and radiofrequency ablation were also discussed in detail today. These risks include but are not limited to stroke, bleeding, vascular damage, tamponade, perforation, damage to the heart and other structures, AV block requiring pacemaker, worsening renal function, and death. The patient understands these risks.  He would like to think about his options and discuss with his insurance company before finalizing his decision which is very reasonable.  He will let us know if he would like to proceed.  In the meantime, he will continue metoprolol at the current dose (100 mg by mouth once daily)  #Hypertension At goal today.  Recommend checking blood pressures 1-2 times per week at home and recording the values.  Recommend bringing these recordings to the primary care physician.   Follow-up as needed or should he decide to proceed with catheter ablation.      Signed, Rossie Muskrat. Lalla Brothers, MD, [facility], [facility] 01/21/2023 10:05 AM    Electrophysiology [facility]

## 2023-01-21 ENCOUNTER — Encounter: Payer: Self-pay | Admitting: Cardiology

## 2023-01-21 ENCOUNTER — Ambulatory Visit: Payer: Self-pay | Attending: Cardiology | Admitting: Cardiology

## 2023-01-21 VITALS — BP 138/78 | HR 63 | Ht 72.0 in | Wt 154.0 lb

## 2023-01-21 DIAGNOSIS — I10 Essential (primary) hypertension: Secondary | ICD-10-CM

## 2023-01-21 DIAGNOSIS — I471 Supraventricular tachycardia, unspecified: Secondary | ICD-10-CM

## 2023-01-21 NOTE — Patient Instructions (Addendum)
Medication Instructions:  Your physician recommends that you continue on your current medications as directed. Please refer to the Current Medication list given to you today.  *If you need a refill on your cardiac medications before your next appointment, please call your pharmacy*   Lab Work: None ordered.  If you have labs (blood work) drawn today and your tests are completely normal, you will receive your results only by: MyChart Message (if you have MyChart) OR A paper copy in the mail If you have any lab test that is abnormal or we need to change your treatment, we will call you to review the results.   Testing/Procedures: None ordered.   Follow-Up: At [facility], you and your health needs are our priority.  As part of our continuing mission to provide you with exceptional heart care, we have created designated Provider Care Teams.  These Care Teams include your primary Cardiologist (physician) and Advanced Practice Providers (APPs -  Physician Assistants and Nurse Practitioners) who all work together to provide you with the care you need, when you need it.  We recommend signing up for the patient portal called "MyChart".  Sign up information is provided on this After Visit Summary.  MyChart is used to connect with patients for Virtual Visits (Telemedicine).  Patients are able to view lab/test results, encounter notes, upcoming appointments, etc.  Non-urgent messages can be sent to your provider as well.   To learn more about what you can do with MyChart, go to [email or web address].    Your next appointment:   As scheduled    Please call us [phone] if you would like to proceed with the ablation.

## 2023-02-06 IMAGING — CT CT HEART MORP W/ CTA COR W/ SCORE W/ CA W/CM &/OR W/O CM
2 of 11 series · 7 of 20 positions shown, 8 images · non-contrast
Comparison: None.

Addendum:
CLINICAL DATA: Chest pain

EXAM:
Cardiac/Coronary  CTA
TECHNIQUE: The patient was scanned on a Siemens Somatom go.Top scanner.

[Series 25: multiphase % cta coronary 0.60 · axial · 0.34mm/px · z∈[-1183,-1096]mm · 4 of 4015 slices shown, 5 images]
[im 803/4015  vessel]
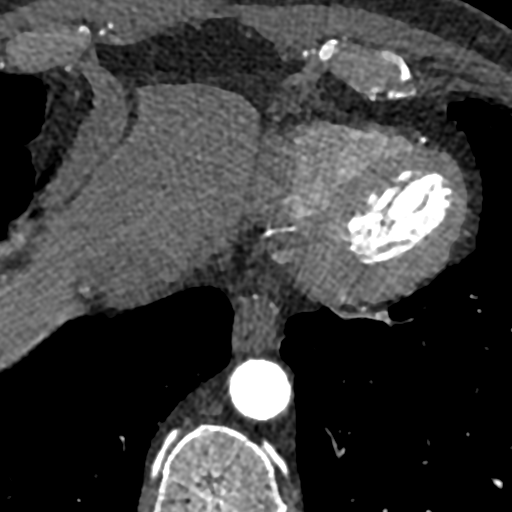
[im 803/4015  lung]
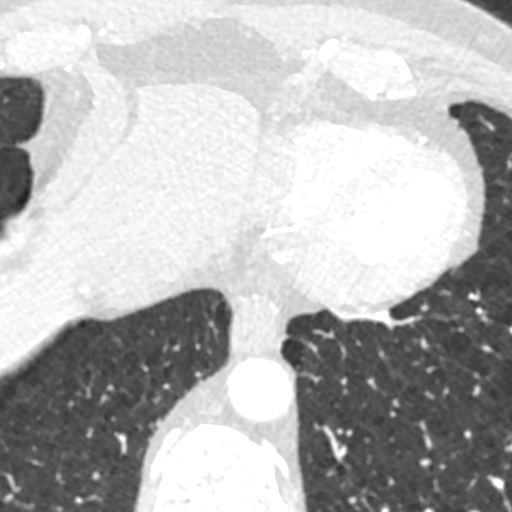
[im 1606/4015  vessel]
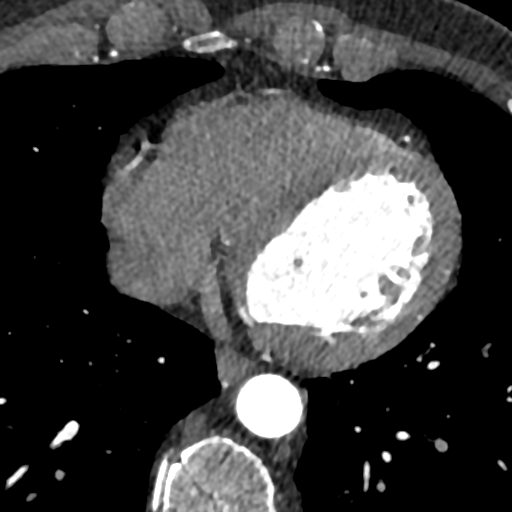
[im 2409/4015  vessel]
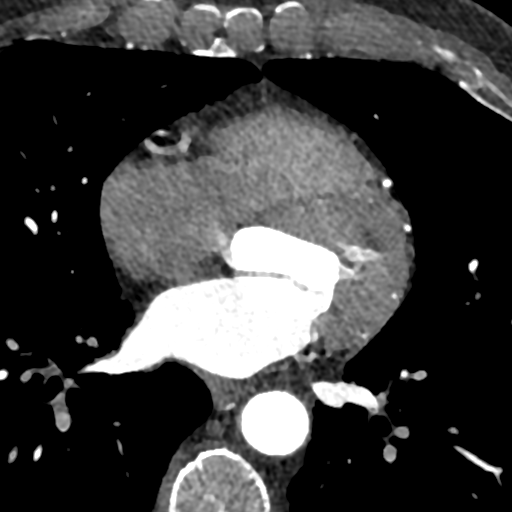
[im 3212/4015  vessel]
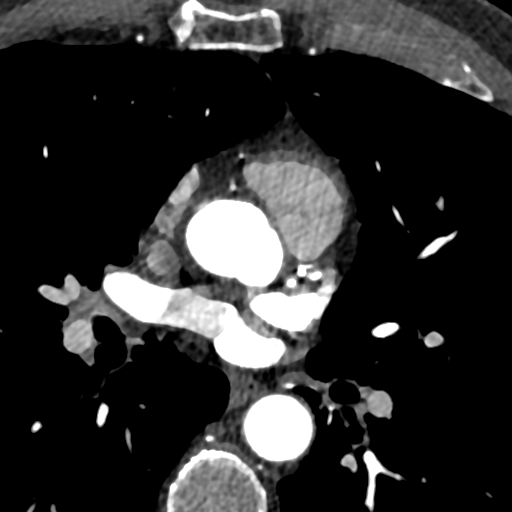

[Series 33: ms multiphase cta coronary 0.60 · axial · 0.34mm/px · z∈[-1176,-1103]mm · 3 of 3285 slices shown]
[im 822/3285  vessel]
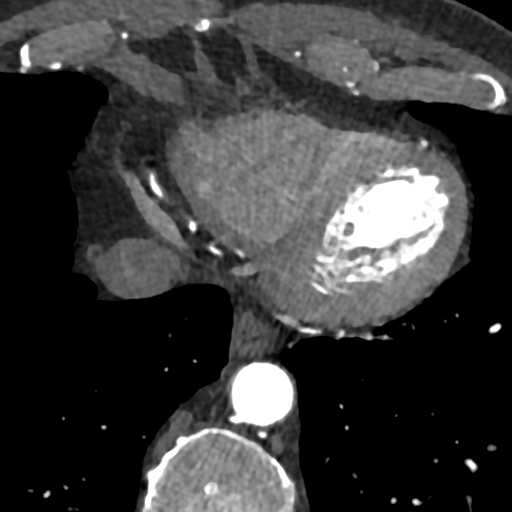
[im 1643/3285  vessel]
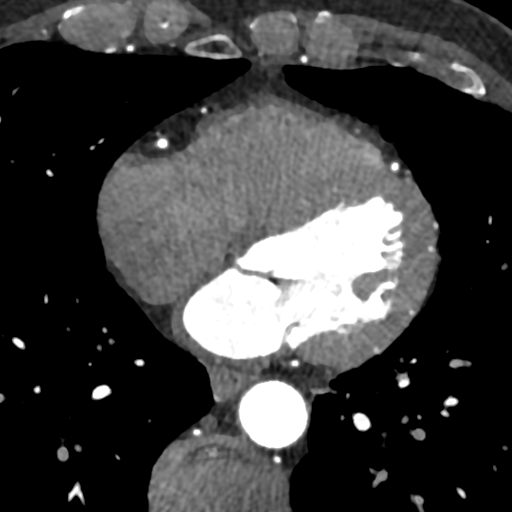
[im 2464/3285  vessel]
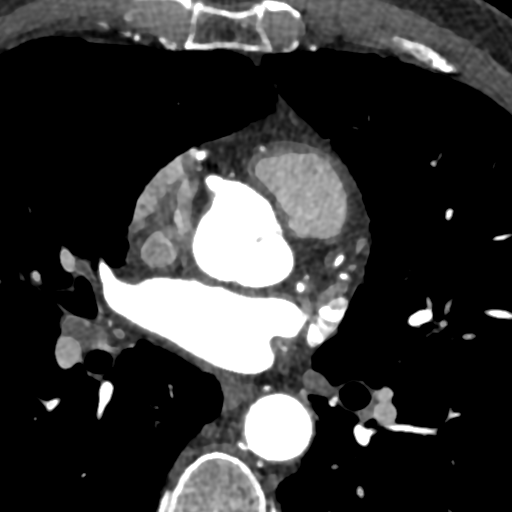

[7 of 20 positions shown; findings below may reference images not displayed]



Aortic Valve:  Trileaflet.  moderate calcifications.

Coronary Arteries:  Normal coronary origin.  Right dominance.

RCA is a dominant artery that gives rise to PDA and PLA. There is
calcified plaque proximally causing mild stenosis (25%).

Left main is a large artery that gives rise to LAD and LCX arteries.
There is no LM disease

LAD has calcified and non calcified plaque in the proximal segment
causing mild stenosis (25-49%).

LCX is a non-dominant artery that gives rise to three obtuse
marginal branches. There is no plaque.

Other findings:

Normal pulmonary vein drainage into the left atrium.

Normal left atrial appendage without a thrombus.

Normal size of the pulmonary artery.
IMPRESSION: 1. Coronary calcium score of 164. This was 79th percentile for age
and sex matched control.

2. Normal coronary origin with right dominance.

3. Mild disease in the proximal LAD and RCA.

4. CAD-RADS 2. Mild non-obstructive CAD (25-49%). Consider
non-atherosclerotic causes of chest pain. Consider preventive
therapy and risk factor modification.

EXAM:
OVER-READ INTERPRETATION  CT CHEST

The following report is an over-read performed by radiologist Dr.
does not include interpretation of cardiac or coronary anatomy or
pathology. The coronary CTA interpretation by the cardiologist is
attached.
FINDINGS: Cardiovascular: Normal heart size. No significant pericardial
effusion/thickening. Minimally atherosclerotic nonaneurysmal
thoracic aorta. Normal caliber pulmonary arteries. No central
pulmonary emboli.

Mediastinum/Nodes: Unremarkable esophagus. No pathologically
enlarged mediastinal or hilar lymph nodes.

Lungs/Pleura: No pneumothorax. No pleural effusion. Moderate to
severe centrilobular emphysema. Tiny sub 5 mm calcified right lower
lobe granulomas. No acute consolidative airspace disease, lung
masses or significant pulmonary nodules.

Upper abdomen: No acute abnormality.

Musculoskeletal: No aggressive appearing focal osseous lesions. Mild
thoracic spondylosis.
IMPRESSION: 1. No acute extracardiac findings.
2. Moderate to severe centrilobular emphysema.
3. Aortic Atherosclerosis (FDR1N-VLB.B) and Emphysema (FDR1N-YVX.R).



Aortic Valve:  Trileaflet.  moderate calcifications.

Coronary Arteries:  Normal coronary origin.  Right dominance.

RCA is a dominant artery that gives rise to PDA and PLA. There is
calcified plaque proximally causing mild stenosis (25%).

Left main is a large artery that gives rise to LAD and LCX arteries.
There is no LM disease

LAD has calcified and non calcified plaque in the proximal segment
causing mild stenosis (25-49%).

LCX is a non-dominant artery that gives rise to three obtuse
marginal branches. There is no plaque.

Other findings:

Normal pulmonary vein drainage into the left atrium.

Normal left atrial appendage without a thrombus.

Normal size of the pulmonary artery.
IMPRESSION: 1. Coronary calcium score of 164. This was 79th percentile for age
and sex matched control.

2. Normal coronary origin with right dominance.

3. Mild disease in the proximal LAD and RCA.

4. CAD-RADS 2. Mild non-obstructive CAD (25-49%). Consider
non-atherosclerotic causes of chest pain. Consider preventive
therapy and risk factor modification.

## 2023-07-10 ENCOUNTER — Other Ambulatory Visit: Payer: Self-pay

## 2023-07-10 DIAGNOSIS — R000 Tachycardia, unspecified: Secondary | ICD-10-CM

## 2023-07-10 MED ORDER — ATORVASTATIN CALCIUM 40 MG PO TABS: 40.0000 mg | ORAL_TABLET | Freq: Every day | ORAL | 3 refills | Status: AC

## 2023-09-03 ENCOUNTER — Ambulatory Visit: Attending: Cardiology | Admitting: Cardiology

## 2023-09-03 ENCOUNTER — Encounter: Payer: Self-pay | Admitting: Cardiology

## 2023-09-03 VITALS — BP 124/72 | HR 57 | Ht 72.0 in | Wt 158.0 lb

## 2023-09-03 DIAGNOSIS — F17200 Nicotine dependence, unspecified, uncomplicated: Secondary | ICD-10-CM

## 2023-09-03 DIAGNOSIS — I2510 Atherosclerotic heart disease of native coronary artery without angina pectoris: Secondary | ICD-10-CM | POA: Diagnosis not present

## 2023-09-03 DIAGNOSIS — I471 Supraventricular tachycardia, unspecified: Secondary | ICD-10-CM | POA: Diagnosis not present

## 2023-09-03 DIAGNOSIS — I10 Essential (primary) hypertension: Secondary | ICD-10-CM | POA: Diagnosis not present

## 2023-09-03 NOTE — Progress Notes (Signed)
 Cardiology Office Note:    Date:  09/03/2023   ID:  [name], DOB 07-02-1955, MRN [id number]  PCP:  Theron Flavin, MD   [facility] Providers Cardiologist:  Constancia Delton, MD Electrophysiologist:  Boyce Byes, MD     Referring MD: Theron Flavin, MD   Chief Complaint  Patient presents with   Follow-up    Yearly follow up visit. Patient states that he feels fine on today. Meds reviewed.     History of Present Illness:    Austin Copeland is a 68 y.o. male with a hx of nonobstructive CAD (Coronary CTA 04/2021 mild nonobstructive LAD and RCA disease), SVT, GERD, current smoker x30+ years who presents for follow-up.   Denies chest pain or shortness of breath.  Currently smokes about 6 to 7 cigarettes daily, working on quitting.  Blood pressure adequately controlled on current medications.  Denies any symptoms of palpitations since increasing dose of Toprol -XL to 100 mg daily.  Feels well, no concerns at this time.  Prior notes Coronary CTA 04/2021 mild nonobstructive LAD and RCA disease, calcium  score 164. Echocardiogram 04/2021, EF 50 to 55%.     Past Medical History:  Diagnosis Date   SVT (supraventricular tachycardia) (HCC)     History reviewed. No pertinent surgical history.  Current Medications: Current Meds  Medication Sig   aspirin  EC 81 MG tablet Take 1 tablet (81 mg total) by mouth daily. Swallow whole.   atorvastatin  (LIPITOR) 40 MG tablet Take 1 tablet (40 mg total) by mouth daily.   loratadine  (CLARITIN ) 10 MG tablet Take 1 tablet (10 mg total) by mouth daily.   metoprolol  succinate (TOPROL -XL) 100 MG 24 hr tablet Take 100 mg by mouth daily.     Allergies:   Patient has no known allergies.   Social History   Socioeconomic History   Marital status: Single    Spouse name: Not on file   Number of children: Not on file   Years of education: Not on file   Highest education level: Not on file  Occupational History   Not on file  Tobacco Use   Smoking  status: Every Day    Current packs/day: 0.00    Types: Cigarettes    Last attempt to quit: 03/2021    Years since quitting: 2.4   Smokeless tobacco: Never   Tobacco comments:    4-5 cigarettes per day   Vaping Use   Vaping status: Never Used  Substance and Sexual Activity   Alcohol use: Never   Drug use: Never   Sexual activity: Not on file  Other Topics Concern   Not on file  Social History Narrative   Not on file   Social Drivers of Health   Financial Resource Strain: Not on file  Food Insecurity: Not on file  Transportation Needs: Not on file  Physical Activity: Not on file  Stress: Not on file  Social Connections: Not on file     Family History: The patient's family history is not on file.  ROS:   Please see the history of present illness.     All other systems reviewed and are negative.  EKGs/Labs/Other Studies Reviewed:    The following studies were reviewed today:  EKG Interpretation Date/Time:  Wednesday Sep 03 2023 11:38:03 EDT Ventricular Rate:  57 PR Interval:  138 QRS Duration:  70 QT Interval:  400 QTC Calculation: 389 R Axis:   61  Text Interpretation: Sinus bradycardia Confirmed by Constancia Delton ([id number]) on 09/03/2023  11:41:58 AM    Recent Labs: 12/12/2022: BUN 14; Creatinine, Ser 1.13; Hemoglobin 14.1; Magnesium  2.4; Platelets 182; Potassium 3.9; Sodium 137; TSH 0.489  Recent Lipid Panel    Component Value Date/Time   CHOL 162 05/20/2022 1019   CHOL 181 05/18/2021 0756   TRIG 72 05/20/2022 1019   HDL 57 05/20/2022 1019   HDL 68 05/18/2021 0756   CHOLHDL 2.8 05/20/2022 1019   VLDL 14 05/20/2022 1019   LDLCALC 91 05/20/2022 1019   LDLCALC 103 (H) 05/18/2021 0756     Risk Assessment/Calculations:          Physical Exam:    VS:  BP 124/72   Pulse (!) 57   Ht 6' (1.829 m)   Wt 158 lb (71.7 kg)   SpO2 99%   BMI 21.43 kg/m     Wt Readings from Last 3 Encounters:  09/03/23 158 lb (71.7 kg)  01/21/23 154 lb (69.9 kg)   12/12/22 158 lb 14.4 oz (72.1 kg)     GEN:  Well nourished, well developed in no acute distress HEENT: Normal NECK: No JVD; No carotid bruits CARDIAC: RRR, no murmurs, rubs, gallops RESPIRATORY:  Clear to auscultation without rales, wheezing or rhonchi  ABDOMEN: Soft, non-tender, non-distended MUSCULOSKELETAL:  No edema; No deformity  SKIN: Warm and dry NEUROLOGIC:  Alert and oriented x 3 PSYCHIATRIC:  Normal affect   ASSESSMENT:    1. Coronary artery disease involving native coronary artery of native heart without angina pectoris   2. SVT (supraventricular tachycardia) (HCC)   3. Primary hypertension   4. Current smoker    PLAN:    In order of problems listed above:  Mild nonobstructive CAD in LAD and RCA.  Denies chest pain.  EF normal.  Continue aspirin  81 mg daily, Lipitor 40 mg daily.   SVT, palpitations adequately controlled on current dose of Toprol -XL.  Consider ablation if symptoms persist despite upping dose of beta-blocker.  Continue Toprol -XL 100 mg daily.  Appreciate input from EP. Hypertension, BP controlled.  Continue Toprol -XL 100 mg daily. Current smoker, smoking cessation advised.  Follow-up yearly.     Medication Adjustments/Labs and Tests Ordered: Current medicines are reviewed at length with the patient today.  Concerns regarding medicines are outlined above.  Orders Placed This Encounter  Procedures   EKG 12-Lead   No orders of the defined types were placed in this encounter.   Patient Instructions  Medication Instructions:  Your Physician recommend you continue on your current medication as directed.    *If you need a refill on your cardiac medications before your next appointment, please call your pharmacy*  Lab Work: No labs ordered today  If you have labs (blood work) drawn today and your tests are completely normal, you will receive your results only by: MyChart Message (if you have MyChart) OR A paper copy in the mail If you have any  lab test that is abnormal or we need to change your treatment, we will call you to review the results.  Testing/Procedures: No test ordered today   Follow-Up: At [facility], you and your health needs are our priority.  As part of our continuing mission to provide you with exceptional heart care, our providers are all part of one team.  This team includes your primary Cardiologist (physician) and Advanced Practice Providers or APPs (Physician Assistants and Nurse Practitioners) who all work together to provide you with the care you need, when you need it.  Your next appointment:  1 year(s)  Provider:   You may see Constancia Delton, MD or one of the following Advanced Practice Providers on your designated Care Team:   Laneta Pintos, NP Gildardo Labrador, PA-C Varney Gentleman, PA-C Cadence Blair, PA-C Ronald Cockayne, NP Morey Ar, NP    We recommend signing up for the patient portal called "MyChart".  Sign up information is provided on this After Visit Summary.  MyChart is used to connect with patients for Virtual Visits (Telemedicine).  Patients are able to view lab/test results, encounter notes, upcoming appointments, etc.  Non-urgent messages can be sent to your provider as well.   To learn more about what you can do with MyChart, go to [email or web address].         Signed, Constancia Delton, MD  09/03/2023 12:23 PM    [facility]

## 2023-09-03 NOTE — Patient Instructions (Signed)

## 2023-09-08 ENCOUNTER — Telehealth: Payer: Self-pay | Admitting: Cardiology

## 2023-09-08 MED ORDER — METOPROLOL SUCCINATE ER 100 MG PO TB24: 100.0000 mg | ORAL_TABLET | Freq: Every day | ORAL | 3 refills | Status: AC

## 2023-09-08 NOTE — Telephone Encounter (Signed)
 RX sent to requested Pharmacy

## 2023-09-08 NOTE — Telephone Encounter (Signed)
*  STAT* If patient is at the pharmacy, call can be transferred to refill team.   1. Which medications need to be refilled? (please list name of each medication and dose if known)   metoprolol  succinate (TOPROL -XL) 100 MG 24 hr tablet     4. Which pharmacy/location (including street and city if local pharmacy) is medication to be sent to?  [id number] - [place],  - [address]     5. Do they need a 30 day or 90 day supply? 90

## 2024-03-15 ENCOUNTER — Other Ambulatory Visit: Payer: Self-pay | Admitting: Internal Medicine

## 2024-03-15 DIAGNOSIS — B182 Chronic viral hepatitis C: Secondary | ICD-10-CM

## 2024-03-23 ENCOUNTER — Ambulatory Visit
Admission: RE | Admit: 2024-03-23 | Discharge: 2024-03-23 | Disposition: A | Discharge status: Home / Self Care | Source: Ambulatory Visit | Attending: Internal Medicine | Admitting: Internal Medicine

## 2024-03-23 DIAGNOSIS — B182 Chronic viral hepatitis C: Secondary | ICD-10-CM | POA: Insufficient documentation
